# Patient Record
Sex: Male | Born: 1965 | Race: White | Hispanic: No | Marital: Married | Smoking: Never smoker
Health system: Southern US, Community
[De-identification: ages and names within clinical notes are randomized; demographics above are authoritative.]

## PROBLEM LIST (undated history)

## (undated) DIAGNOSIS — E119 Type 2 diabetes mellitus without complications: Secondary | ICD-10-CM

## (undated) DIAGNOSIS — N200 Calculus of kidney: Secondary | ICD-10-CM

## (undated) DIAGNOSIS — I10 Essential (primary) hypertension: Secondary | ICD-10-CM

## (undated) HISTORY — PX: TOTAL HIP ARTHROPLASTY: SHX124

---

## 2006-12-27 ENCOUNTER — Emergency Department (HOSPITAL_COMMUNITY): Admission: EM | Admit: 2006-12-27 | Discharge: 2006-12-27 | Payer: Self-pay | Admitting: Emergency Medicine

## 2018-02-18 ENCOUNTER — Encounter (HOSPITAL_BASED_OUTPATIENT_CLINIC_OR_DEPARTMENT_OTHER): Payer: Self-pay

## 2018-02-18 ENCOUNTER — Emergency Department (HOSPITAL_BASED_OUTPATIENT_CLINIC_OR_DEPARTMENT_OTHER): Payer: No Typology Code available for payment source

## 2018-02-18 ENCOUNTER — Other Ambulatory Visit: Payer: Self-pay

## 2018-02-18 ENCOUNTER — Emergency Department (HOSPITAL_BASED_OUTPATIENT_CLINIC_OR_DEPARTMENT_OTHER)
Admission: EM | Admit: 2018-02-18 | Discharge: 2018-02-19 | Disposition: A | Discharge status: Home / Self Care | Payer: No Typology Code available for payment source | Attending: Emergency Medicine | Admitting: Emergency Medicine

## 2018-02-18 DIAGNOSIS — R1031 Right lower quadrant pain: Secondary | ICD-10-CM | POA: Diagnosis present

## 2018-02-18 DIAGNOSIS — N200 Calculus of kidney: Secondary | ICD-10-CM | POA: Diagnosis not present

## 2018-02-18 DIAGNOSIS — Z96642 Presence of left artificial hip joint: Secondary | ICD-10-CM | POA: Diagnosis not present

## 2018-02-18 DIAGNOSIS — I10 Essential (primary) hypertension: Secondary | ICD-10-CM | POA: Diagnosis not present

## 2018-02-18 DIAGNOSIS — E119 Type 2 diabetes mellitus without complications: Secondary | ICD-10-CM | POA: Insufficient documentation

## 2018-02-18 HISTORY — DX: Calculus of kidney: N20.0

## 2018-02-18 HISTORY — DX: Type 2 diabetes mellitus without complications: E11.9

## 2018-02-18 HISTORY — DX: Essential (primary) hypertension: I10

## 2018-02-18 LAB — CBC WITH DIFFERENTIAL/PLATELET
Basophils Absolute: 0 10*3/uL (ref 0.0–0.1)
Basophils Relative: 0 %
EOS PCT: 3 %
Eosinophils Absolute: 0.2 10*3/uL (ref 0.0–0.7)
HCT: 42.6 % (ref 39.0–52.0)
Hemoglobin: 14.3 g/dL (ref 13.0–17.0)
LYMPHS ABS: 1.8 10*3/uL (ref 0.7–4.0)
LYMPHS PCT: 25 %
MCH: 31.8 pg (ref 26.0–34.0)
MCHC: 33.6 g/dL (ref 30.0–36.0)
MCV: 94.7 fL (ref 78.0–100.0)
MONO ABS: 0.5 10*3/uL (ref 0.1–1.0)
Monocytes Relative: 7 %
Neutro Abs: 4.5 10*3/uL (ref 1.7–7.7)
Neutrophils Relative %: 65 %
PLATELETS: 174 10*3/uL (ref 150–400)
RBC: 4.5 MIL/uL (ref 4.22–5.81)
RDW: 13.2 % (ref 11.5–15.5)
WBC: 6.9 10*3/uL (ref 4.0–10.5)

## 2018-02-18 LAB — BASIC METABOLIC PANEL
Anion gap: 7 (ref 5–15)
BUN: 13 mg/dL (ref 6–20)
CHLORIDE: 109 mmol/L (ref 98–111)
CO2: 24 mmol/L (ref 22–32)
CREATININE: 0.92 mg/dL (ref 0.61–1.24)
Calcium: 8.7 mg/dL — ABNORMAL LOW (ref 8.9–10.3)
GFR calc Af Amer: >60 mL/min (ref >60)
Glucose, Bld: 149 mg/dL — ABNORMAL HIGH (ref 70–99)
POTASSIUM: 3.7 mmol/L (ref 3.5–5.1)
SODIUM: 140 mmol/L (ref 135–145)

## 2018-02-18 LAB — URINALYSIS, MICROSCOPIC (REFLEX)

## 2018-02-18 LAB — URINALYSIS, ROUTINE W REFLEX MICROSCOPIC
BILIRUBIN URINE: NEGATIVE
GLUCOSE, UA: NEGATIVE mg/dL
Ketones, ur: NEGATIVE mg/dL
Leukocytes, UA: NEGATIVE
Nitrite: NEGATIVE
PROTEIN: NEGATIVE mg/dL
Specific Gravity, Urine: >1.03 — ABNORMAL HIGH (ref 1.005–1.030)
pH: 5.5 (ref 5.0–8.0)

## 2018-02-18 MED ORDER — KETOROLAC TROMETHAMINE 15 MG/ML IJ SOLN
15.0000 mg | Freq: Once | INTRAMUSCULAR | Status: AC
  Administered 2018-02-18: 15 mg via INTRAVENOUS
  Filled 2018-02-18: qty 1

## 2018-02-18 MED ORDER — HYDROMORPHONE HCL 1 MG/ML IJ SOLN
1.0000 mg | Freq: Once | INTRAMUSCULAR | Status: AC
  Administered 2018-02-18: 1 mg via INTRAVENOUS
  Filled 2018-02-18: qty 1

## 2018-02-18 MED ORDER — ONDANSETRON HCL 4 MG/2ML IJ SOLN
4.0000 mg | Freq: Once | INTRAMUSCULAR | Status: AC | PRN
  Administered 2018-02-18: 4 mg via INTRAVENOUS
  Filled 2018-02-18: qty 2

## 2018-02-18 MED ORDER — ACETAMINOPHEN 10 MG/ML IV SOLN
1000.0000 mg | Freq: Once | INTRAVENOUS | Status: DC
  Filled 2018-02-18: qty 100

## 2018-02-18 MED ORDER — MORPHINE SULFATE (PF) 4 MG/ML IV SOLN
4.0000 mg | Freq: Once | INTRAVENOUS | Status: AC
  Administered 2018-02-18: 4 mg via INTRAVENOUS
  Filled 2018-02-18: qty 1

## 2018-02-18 MED ORDER — FENTANYL CITRATE (PF) 100 MCG/2ML IJ SOLN
50.0000 ug | INTRAMUSCULAR | Status: AC | PRN
  Administered 2018-02-18 (×2): 50 ug via INTRAVENOUS
  Filled 2018-02-18 (×2): qty 2

## 2018-02-18 MED ORDER — LIDOCAINE HCL (CARDIAC) PF 100 MG/5ML IV SOSY
1.5000 mg/kg | PREFILLED_SYRINGE | Freq: Once | INTRAVENOUS | Status: AC
  Administered 2018-02-18: 224.6 mg via INTRAVENOUS
  Filled 2018-02-18: qty 15

## 2018-02-18 NOTE — ED Provider Notes (Signed)
[facility] EMERGENCY DEPARTMENT Provider Note   CSN: [id number] Arrival date & time: 02/18/18  1918    History   Chief Complaint Chief Complaint  Patient presents with  . Flank Pain    HPI Dustin Nelson is a 52 y.o. male who presents with R flank pain. PMH significant for DM, HTN, hx of kidney stones. He states that he's been having intermittent urinary urgency over the past week along with some lower abdominal discomfort. Today he woke up and felt well. This afternoon he had an acute onset of severe R flank pain after he urinated that feels similar to prior episodes of kidney stones. He reports associated nausea without vomiting. No fever, chills, diarrhea, constipation, hematuria. His last kidney stone was ~3 years ago which he thinks he was able to pass however he has required multiple surgeries to have the kidney stones removed. Pain is currently 10/10.  HPI  Past Medical History:  Diagnosis Date  . Diabetes mellitus without complication (HCC)   . Hypertension   . Kidney stones     There are no active problems to display for this patient.   Past Surgical History:  Procedure Laterality Date  . TOTAL HIP ARTHROPLASTY Left         Home Medications    Prior to Admission medications   Not on File    Family History No family history on file.  Social History Social History   Tobacco Use  . Smoking status: Never Smoker  . Smokeless tobacco: Never Used  Substance Use Topics  . Alcohol use: Never    Frequency: Never  . Drug use: Never     Allergies   Patient has no known allergies.   Review of Systems Review of Systems  Constitutional: Negative for chills and fever.  Gastrointestinal: Positive for abdominal pain and nausea. Negative for constipation, diarrhea and vomiting.  Genitourinary: Positive for difficulty urinating, dysuria and flank pain. Negative for frequency and hematuria.  All other systems reviewed and are  negative.    Physical Exam Updated Vital Signs BP (!) 151/98 (BP Location: Right Arm)   Pulse 86   Temp 98.2 F (36.8 C) (Oral)   Resp (!) 28   Ht 6\' 4"  (1.93 m)   Wt (!) 149.7 kg (330 lb)   SpO2 98%   BMI 40.17 kg/m   Physical Exam  Constitutional: He is oriented to person, place, and time. He appears well-developed and well-nourished. He appears distressed (mildly due to pain).  HENT:  Head: Normocephalic and atraumatic.  Eyes: Pupils are equal, round, and reactive to light. Conjunctivae are normal. Right eye exhibits no discharge. Left eye exhibits no discharge. No scleral icterus.  Neck: Normal range of motion.  Cardiovascular: Normal rate and regular rhythm.  Pulmonary/Chest: Effort normal and breath sounds normal. No respiratory distress.  Abdominal: Soft. Bowel sounds are normal. He exhibits no distension and no mass. There is tenderness (R flank and CVA tenderness). There is no rebound and no guarding. No hernia.  Obese abdomen  Neurological: He is alert and oriented to person, place, and time.  Skin: Skin is warm and dry.  Psychiatric: He has a normal mood and affect. His behavior is normal.  Nursing note and vitals reviewed.    ED Treatments / Results  Labs (all labs ordered are listed, but only abnormal results are displayed) Labs Reviewed  URINALYSIS, ROUTINE W REFLEX MICROSCOPIC - Abnormal; Notable for the following components:      Result Value  Specific Gravity, Urine >1.030 (*)    Hgb urine dipstick LARGE (*)    All other components within normal limits  BASIC METABOLIC PANEL - Abnormal; Notable for the following components:   Glucose, Bld 149 (*)    Calcium 8.7 (*)    All other components within normal limits  URINALYSIS, MICROSCOPIC (REFLEX) - Abnormal; Notable for the following components:   Bacteria, UA RARE (*)    All other components within normal limits  CBC WITH DIFFERENTIAL/PLATELET    EKG None  Radiology Ct Renal Stone  Study  Result Date: 02/18/2018 CLINICAL DATA:  Right flank pain today EXAM: CT ABDOMEN AND PELVIS WITHOUT CONTRAST TECHNIQUE: Multidetector CT imaging of the abdomen and pelvis was performed following the standard protocol without IV contrast. COMPARISON:  12/27/2006 FINDINGS: Lower chest: Normal heart size without pericardial effusion. Clear lung bases. Hepatobiliary: Hepatic steatosis without space-occupying mass. Decompressed gallbladder without stones. Faint hyperdensities about the gallbladder fossa likely reflect areas of focal fatty sparing. Pancreas: Normal Spleen: Normal Adrenals/Urinary Tract: Left-sided renal calculi measuring to 5 mm in the interpolar and lower pole. No left-sided hydroureteronephrosis nor obstruction. Mild right-sided hydroureteronephrosis secondary to a stone in the right ureterovesical junction measuring 9 x 4 x 8 mm. The urinary bladder is decompressed in appearance. Stomach/Bowel: Stomach is within normal limits. Appendix appears normal. No evidence of bowel wall thickening, distention, or inflammatory changes. Vascular/Lymphatic: No significant vascular findings are present. No enlarged abdominal or pelvic lymph nodes. Reproductive: Normal prostate and seminal vesicles. Other: No free air nor free fluid. Small fat containing umbilical hernia. Musculoskeletal: No acute osseous abnormality. Mild facet arthropathy from L3 through S1. IMPRESSION: 1. Mild right-sided hydroureteronephrosis secondary to a 9 x 4 x 8 mm calculus at the right ureterovesical junction. 2. Left-sided nonobstructing renal calculi measuring up to 5 mm. 3. Hepatic steatosis. Electronically Signed   By: Tollie Eth M.D.   On: 02/18/2018 20:27    Procedures Procedures (including critical care time)  Medications Ordered in ED Medications  fentaNYL (SUBLIMAZE) injection 50 mcg (50 mcg Intravenous Given 02/18/18 2043)  ondansetron (ZOFRAN) injection 4 mg (4 mg Intravenous Given 02/18/18 1938)  ketorolac  (TORADOL) 15 MG/ML injection 15 mg (15 mg Intravenous Given 02/18/18 2024)  HYDROmorphone (DILAUDID) injection 1 mg (1 mg Intravenous Given 02/18/18 2101)  morphine 4 MG/ML injection 4 mg (4 mg Intravenous Given 02/18/18 2232)  lidocaine (cardiac) 100 mg/60mL (XYLOCAINE) injection 2% 224.6 mg (224.6 mg Intravenous Given 02/18/18 2329)     Initial Impression / Assessment and Plan / ED Course  I have reviewed the triage vital signs and the nursing notes.  Pertinent labs & imaging results that were available during my care of the patient were reviewed by me and considered in my medical decision making (see chart for details).  52 year old male with R flank pain starting today which feels similar to prior episodes of kidney stones. He is hypertensive but otherwise vitals are normal. He is in mild distress due to pain on exam. Will obtain labs, UA, CT renal  CBC is normal. BMP is remarkable for mild hyperglycemia. Kidney function is normal. UA has large hgb, >1.030 specific gravity, rare bacteria, 21-50 RBC. He has received Fentanyl x 2, 1mg  Diluadid, Toradol with mild relief. Pain is still 7/10. Will consult Urology.  10:34 PM Spoke with Dr. Vernie Ammons with Urology. He advised IV Lidocaine and Tylenol.  11:28 PM Pain is 5/10 after Morphine. We do not have Tylenol in pyxis here.  Will give Lidocaine and reassess.  12:17 AM Pt reports pain is improved. He states his whole body feels numb. He is agreeable to going home and following up as outpatient. Rx for pain medicine, Ibuprofen, Zofran given. He has Flomax. Referral to urology was placed. Return precautions given.   Final Clinical Impressions(s) / ED Diagnoses   Final diagnoses:  Right kidney stone    ED Discharge Orders    None       Bethel BornGekas, Zyiere Rosemond Marie, PA-C 02/19/18 0017    Gwyneth SproutPlunkett, Whitney, MD 02/19/18 1343

## 2018-02-18 NOTE — ED Notes (Signed)
Family at bedside. 

## 2018-02-18 NOTE — Discharge Instructions (Addendum)
Take narcotic pain medicine as needed. Do not drink alcohol or drive while taking this medicine Ibuprofen 600mg  - Take for pain and inflammation. Take with food three times daily Take Zofran for nausea Take Flomax to help stone pass Call urology tomorrow for an appointment

## 2018-02-18 NOTE — ED Notes (Signed)
No changes on monitor after first 100mg s of lidocaine

## 2018-02-18 NOTE — ED Triage Notes (Signed)
Pt reports problem with urine flow this week, but today sudden onset of severe pain. Pt states feels like previous kidney stones.

## 2018-02-18 NOTE — ED Notes (Signed)
patient is aware we need a urine specimen. Patient will try as soon as RN is done with IV.

## 2018-02-18 NOTE — ED Notes (Signed)
ED Provider at bedside. 

## 2018-02-19 MED ORDER — OXYCODONE-ACETAMINOPHEN 5-325 MG PO TABS: 1.0000 | ORAL_TABLET | ORAL | 0 refills | Status: DC | PRN

## 2018-02-19 MED ORDER — IBUPROFEN 600 MG PO TABS: 600.0000 mg | ORAL_TABLET | Freq: Four times a day (QID) | ORAL | 0 refills | Status: AC | PRN

## 2018-02-19 MED ORDER — ONDANSETRON 4 MG PO TBDP: 4.0000 mg | ORAL_TABLET | Freq: Three times a day (TID) | ORAL | 0 refills | Status: DC | PRN

## 2019-05-25 IMAGING — CT CT RENAL STONE PROTOCOL
2 of 4 series · 17 of 46 positions shown, 19 images · non-contrast
Comparison: 12/27/2006

CLINICAL DATA: Right flank pain today

EXAM:
CT ABDOMEN AND PELVIS WITHOUT CONTRAST
TECHNIQUE: Multidetector CT imaging of the abdomen and pelvis was performed
following the standard protocol without IV contrast.

[Series 2: axial st · axial · 0.90mm/px · z∈[-458,+38]mm · 14 of 109 slices shown, 16 images]
[im 5/109  soft-tissue]
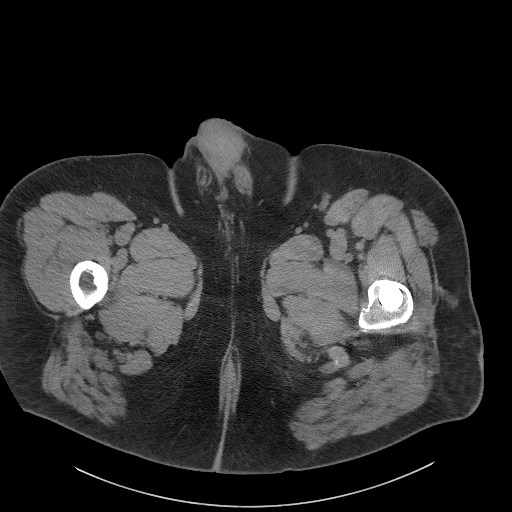
[im 5/109  bone]
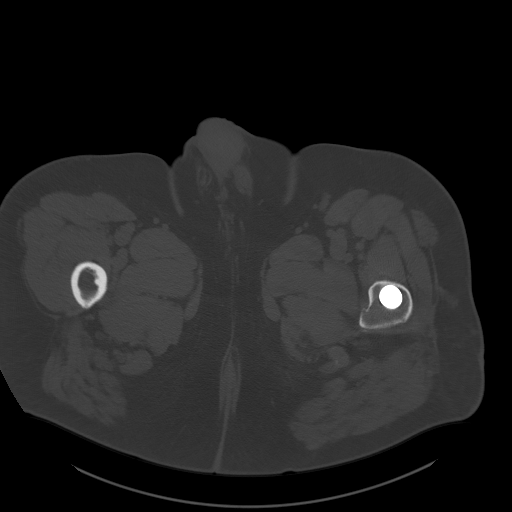
[im 14/109  soft-tissue]
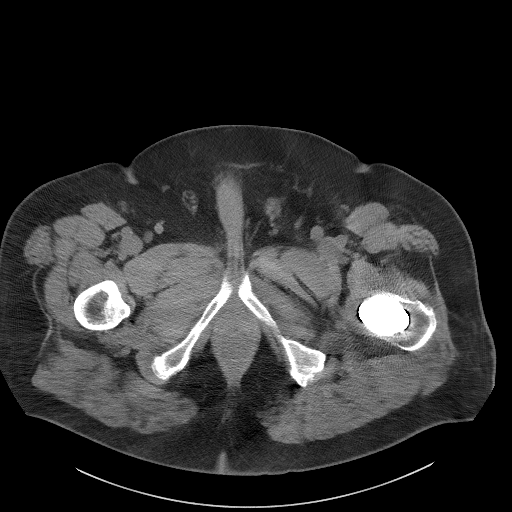
[im 23/109  soft-tissue]
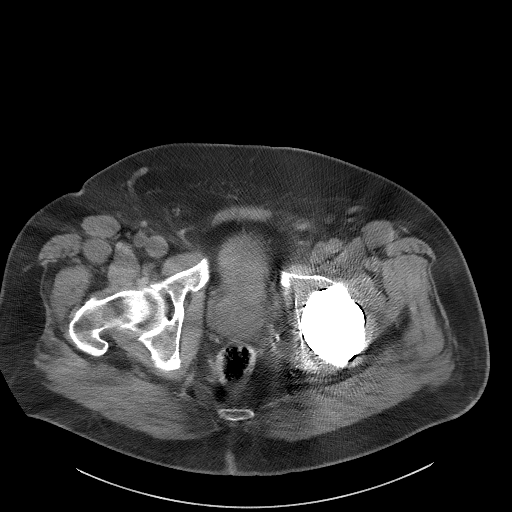
[im 28/109  soft-tissue]
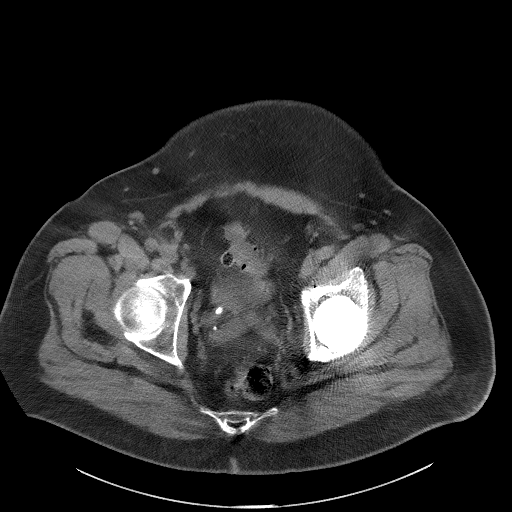
[im 37/109  soft-tissue]
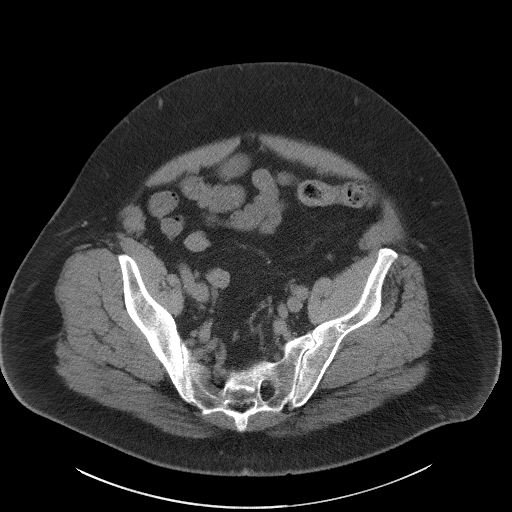
[im 46/109  soft-tissue]
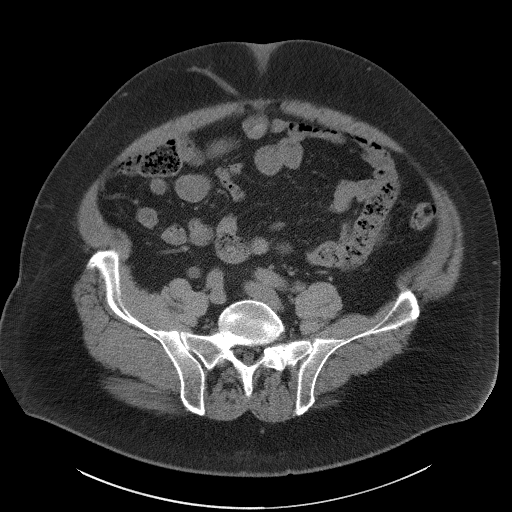
[im 50/109  soft-tissue]
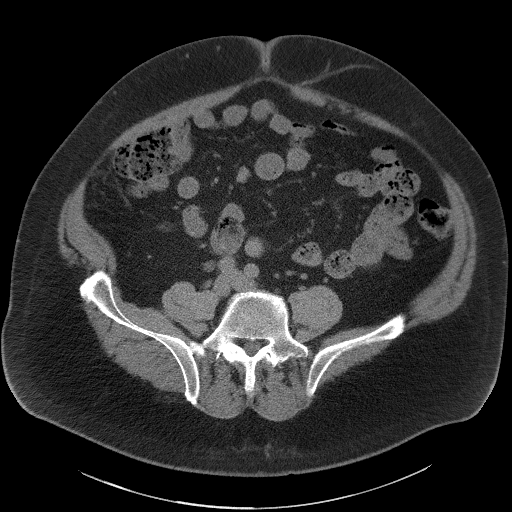
[im 59/109  soft-tissue]
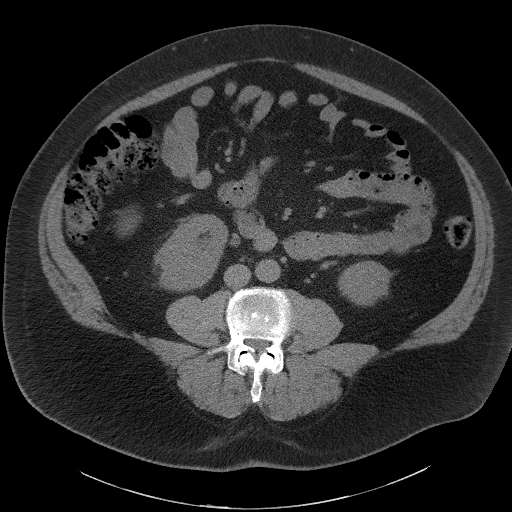
[im 64/109  soft-tissue]
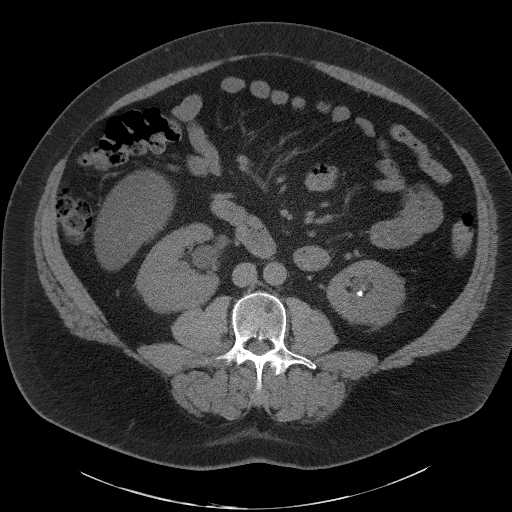
[im 64/109  bone]
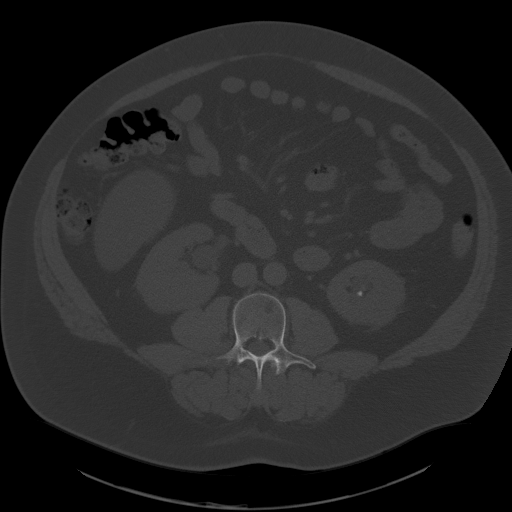
[im 73/109  soft-tissue]
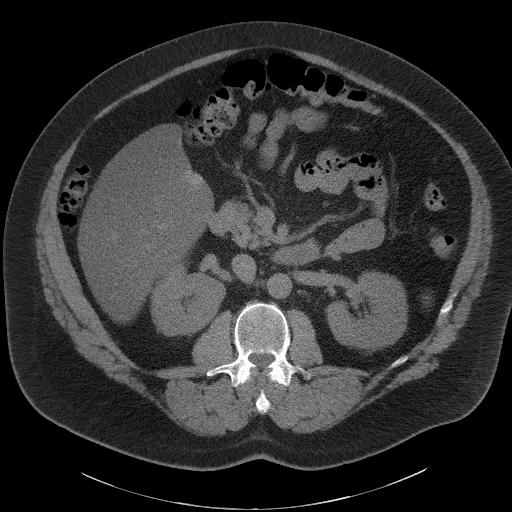
[im 82/109  soft-tissue]
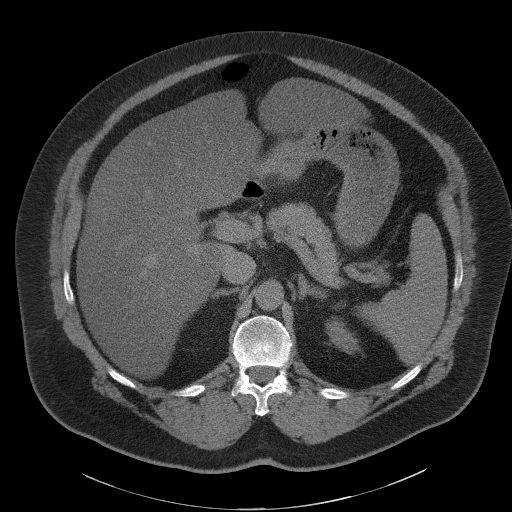
[im 86/109  soft-tissue]
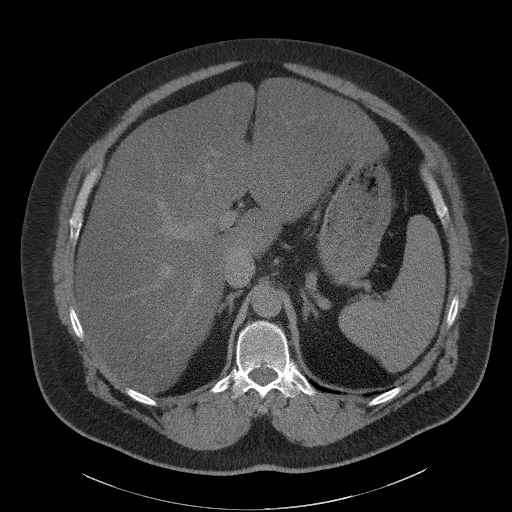
[im 95/109  soft-tissue]
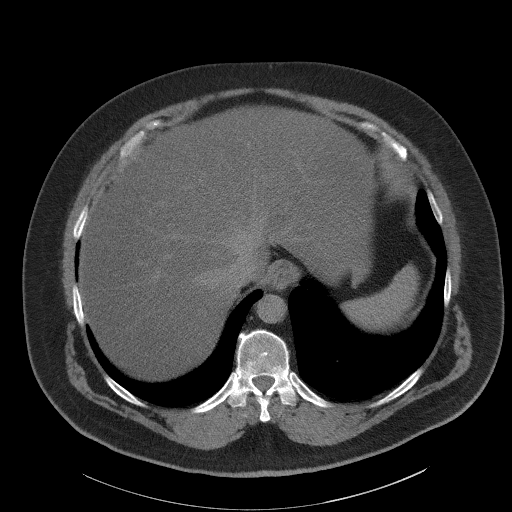
[im 104/109  soft-tissue]
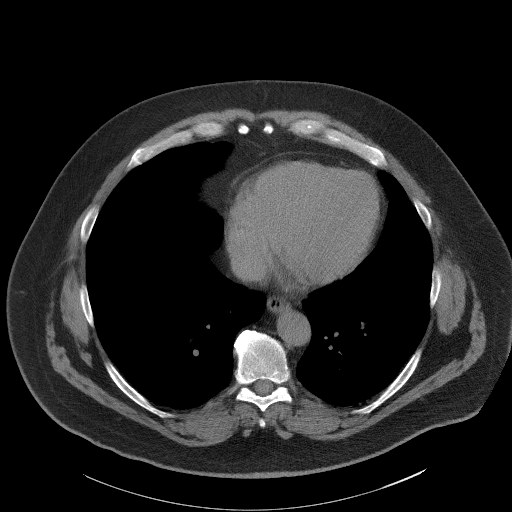

[Series 5: coronal st · coronal · 1.06mm/px · 3 of 133 slices shown]
[im 45/133  soft-tissue]
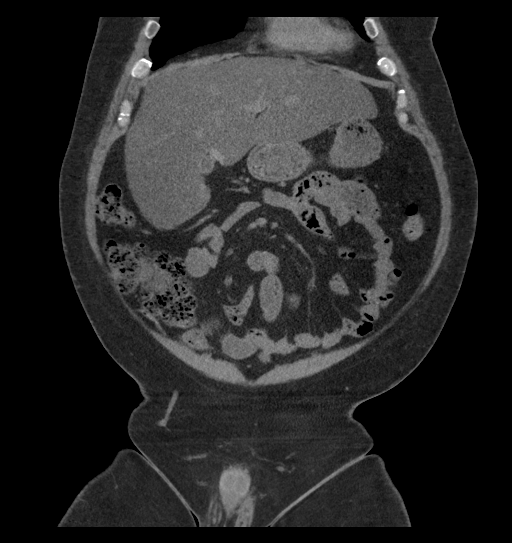
[im 59/133  soft-tissue]
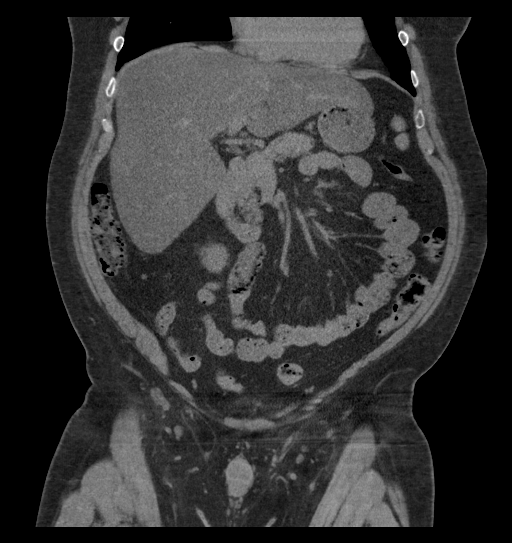
[im 74/133  soft-tissue]
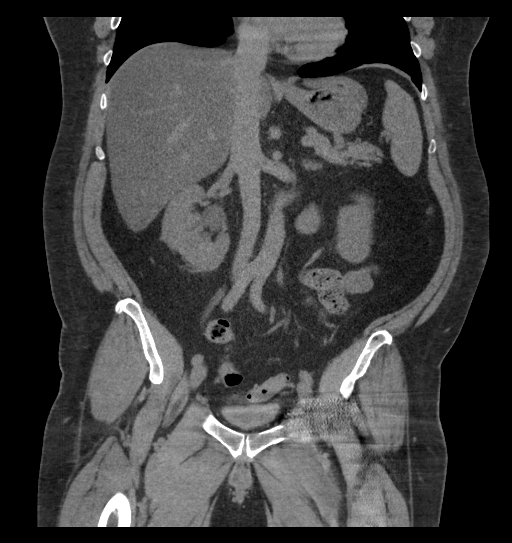

[17 of 46 positions shown; findings below may reference images not displayed]

FINDINGS: Lower chest: Normal heart size without pericardial effusion. Clear
lung bases.

Hepatobiliary: Hepatic steatosis without space-occupying mass.
Decompressed gallbladder without stones. Faint hyperdensities about
the gallbladder fossa likely reflect areas of focal fatty sparing.

Pancreas: Normal

Spleen: Normal

Adrenals/Urinary Tract: Left-sided renal calculi measuring to 5 mm
in the interpolar and lower pole. No left-sided
hydroureteronephrosis nor obstruction. Mild right-sided
hydroureteronephrosis secondary to a stone in the right
ureterovesical junction measuring 9 x 4 x 8 mm. The urinary bladder
is decompressed in appearance.

Stomach/Bowel: Stomach is within normal limits. Appendix appears
normal. No evidence of bowel wall thickening, distention, or
inflammatory changes.

Vascular/Lymphatic: No significant vascular findings are present. No
enlarged abdominal or pelvic lymph nodes.

Reproductive: Normal prostate and seminal vesicles.

Other: No free air nor free fluid. Small fat containing umbilical
hernia.

Musculoskeletal: No acute osseous abnormality. Mild facet
arthropathy from L3 through S1.
IMPRESSION: 1. Mild right-sided hydroureteronephrosis secondary to a 9 x 4 x 8
mm calculus at the right ureterovesical junction.
2. Left-sided nonobstructing renal calculi measuring up to 5 mm.
3. Hepatic steatosis.

## 2019-07-19 ENCOUNTER — Other Ambulatory Visit: Payer: Self-pay

## 2019-07-19 ENCOUNTER — Emergency Department (HOSPITAL_BASED_OUTPATIENT_CLINIC_OR_DEPARTMENT_OTHER)
Admission: EM | Admit: 2019-07-19 | Discharge: 2019-07-19 | Disposition: A | Discharge status: Home / Self Care | Payer: PRIVATE HEALTH INSURANCE | Attending: Emergency Medicine | Admitting: Emergency Medicine

## 2019-07-19 ENCOUNTER — Emergency Department (HOSPITAL_BASED_OUTPATIENT_CLINIC_OR_DEPARTMENT_OTHER): Payer: PRIVATE HEALTH INSURANCE

## 2019-07-19 ENCOUNTER — Encounter (HOSPITAL_BASED_OUTPATIENT_CLINIC_OR_DEPARTMENT_OTHER): Payer: Self-pay

## 2019-07-19 DIAGNOSIS — M25562 Pain in left knee: Secondary | ICD-10-CM | POA: Insufficient documentation

## 2019-07-19 DIAGNOSIS — W010XXA Fall on same level from slipping, tripping and stumbling without subsequent striking against object, initial encounter: Secondary | ICD-10-CM | POA: Diagnosis not present

## 2019-07-19 DIAGNOSIS — I10 Essential (primary) hypertension: Secondary | ICD-10-CM | POA: Insufficient documentation

## 2019-07-19 DIAGNOSIS — Y999 Unspecified external cause status: Secondary | ICD-10-CM | POA: Insufficient documentation

## 2019-07-19 DIAGNOSIS — W19XXXA Unspecified fall, initial encounter: Secondary | ICD-10-CM

## 2019-07-19 DIAGNOSIS — M791 Myalgia, unspecified site: Secondary | ICD-10-CM | POA: Diagnosis not present

## 2019-07-19 DIAGNOSIS — M25512 Pain in left shoulder: Secondary | ICD-10-CM | POA: Insufficient documentation

## 2019-07-19 DIAGNOSIS — Y929 Unspecified place or not applicable: Secondary | ICD-10-CM | POA: Diagnosis not present

## 2019-07-19 DIAGNOSIS — S62114A Nondisplaced fracture of triquetrum [cuneiform] bone, right wrist, initial encounter for closed fracture: Secondary | ICD-10-CM | POA: Insufficient documentation

## 2019-07-19 DIAGNOSIS — E119 Type 2 diabetes mellitus without complications: Secondary | ICD-10-CM | POA: Insufficient documentation

## 2019-07-19 DIAGNOSIS — Y9301 Activity, walking, marching and hiking: Secondary | ICD-10-CM | POA: Diagnosis not present

## 2019-07-19 DIAGNOSIS — S6991XA Unspecified injury of right wrist, hand and finger(s), initial encounter: Secondary | ICD-10-CM | POA: Diagnosis present

## 2019-07-19 MED ORDER — IBUPROFEN 600 MG PO TABS: 600.0000 mg | ORAL_TABLET | Freq: Four times a day (QID) | ORAL | 0 refills | Status: DC | PRN

## 2019-07-19 MED ORDER — DIAZEPAM 5 MG PO TABS: 10.0000 mg | ORAL_TABLET | Freq: Every evening | ORAL | 0 refills | Status: DC | PRN

## 2019-07-19 MED ORDER — IBUPROFEN 800 MG PO TABS
800.0000 mg | ORAL_TABLET | Freq: Once | ORAL | Status: AC
  Administered 2019-07-19: 800 mg via ORAL
  Filled 2019-07-19: qty 1

## 2019-07-19 NOTE — ED Notes (Signed)
Pt returned to nurse first after leaving [facility], states he can get the meds cheaper at [place], requesting we print prescritions. Pharmacy changed in computer and MD resent script to [facility] main, pt informed

## 2019-07-19 NOTE — Discharge Instructions (Addendum)
You have a small fracture of the right wrist (triquetrum bone).  We applied a splint today.  You should call to follow-up with an orthopedic hand surgeon in approximately 1 to 2 weeks.  In the meantime you should wear your splint at all times.

## 2019-07-19 NOTE — ED Provider Notes (Signed)
[facility] EMERGENCY DEPARTMENT Provider Note   CSN: [id number] Arrival date & time: 07/19/19  1234     History   Chief Complaint Chief Complaint  Patient presents with  . Fall    HPI Dustin Nelson is a 53 y.o. male male with a history of hypertension, hyperlipidemia, diabetes, obesity, present emergency department for mechanical fall.  Reports he tripped and unsteady pavement yesterday and fell forward onto both hands.  He says he took a large tumble and banged his knees, as well as left shoulder.  He now presents to the ER complaining that he has pain all over his body.  Pain is worse in his left shoulder and his left knee and his right wrist.  He denies striking his head or loss of consciousness.     HPI  Past Medical History:  Diagnosis Date  . Diabetes mellitus without complication (HCC)   . Hypertension   . Kidney stones     There are no active problems to display for this patient.   Past Surgical History:  Procedure Laterality Date  . TOTAL HIP ARTHROPLASTY Left         Home Medications    Prior to Admission medications   Medication Sig Start Date End Date Taking? Authorizing Provider  diazepam (VALIUM) 5 MG tablet Take 2 tablets (10 mg total) by mouth at bedtime as needed for up to 10 doses for muscle spasms. 07/19/19   Terald Sleeperrifan, Kellon Chalk J, MD  ibuprofen (ADVIL) 600 MG tablet Take 1 tablet (600 mg total) by mouth every 6 (six) hours as needed for up to 30 doses for mild pain or moderate pain. 07/19/19   Terald Sleeperrifan, Kris Burd J, MD  ibuprofen (ADVIL,MOTRIN) 600 MG tablet Take 1 tablet (600 mg total) by mouth every 6 (six) hours as needed. 02/19/18   Bethel BornGekas, Kelly Marie, PA-C  ondansetron (ZOFRAN ODT) 4 MG disintegrating tablet Take 1 tablet (4 mg total) by mouth every 8 (eight) hours as needed for nausea or vomiting. 02/19/18   Bethel BornGekas, Kelly Marie, PA-C  oxyCODONE-acetaminophen (PERCOCET/ROXICET) 5-325 MG tablet Take 1 tablet by mouth every 4 (four) hours as  needed for severe pain. 02/19/18   Bethel BornGekas, Kelly Marie, PA-C    Family History No family history on file.  Social History Social History   Tobacco Use  . Smoking status: Never Smoker  . Smokeless tobacco: Never Used  Substance Use Topics  . Alcohol use: Never    Frequency: Never  . Drug use: Never     Allergies   Patient has no known allergies.   Review of Systems Review of Systems  Respiratory: Negative for cough and shortness of breath.   Cardiovascular: Negative for chest pain and palpitations.  Gastrointestinal: Negative for abdominal pain, nausea and vomiting.  Genitourinary: Negative for dysuria and hematuria.  Musculoskeletal: Positive for arthralgias, back pain, joint swelling and myalgias. Negative for neck pain.  Skin: Positive for wound. Negative for rash.  Neurological: Negative for syncope and light-headedness.  Psychiatric/Behavioral: Negative for agitation and confusion.  All other systems reviewed and are negative.    Physical Exam Updated Vital Signs BP (!) 145/85 (BP Location: Right Arm)   Pulse 77   Temp 99 F (37.2 C) (Oral)   Resp 14   Ht 6\' 4"  (1.93 m)   Wt 136.1 kg   SpO2 98%   BMI 36.52 kg/m   Physical Exam Vitals signs and nursing note reviewed.  Constitutional:      Appearance: He is  well-developed.  HENT:     Head: Normocephalic and atraumatic.  Eyes:     Conjunctiva/sclera: Conjunctivae normal.  Neck:     Musculoskeletal: Normal range of motion and neck supple. No neck rigidity or muscular tenderness.  Cardiovascular:     Rate and Rhythm: Normal rate and regular rhythm.     Pulses: Normal pulses.  Pulmonary:     Effort: Pulmonary effort is normal. No respiratory distress.  Abdominal:     Palpations: Abdomen is soft.     Tenderness: There is no abdominal tenderness.  Musculoskeletal:     Comments: TTP of left patella TTP of right wrist TTP of left shoulder  Skin:    General: Skin is warm and dry.  Neurological:      Mental Status: He is alert.  Psychiatric:        Mood and Affect: Mood normal.        Behavior: Behavior normal.      ED Treatments / Results  Labs (all labs ordered are listed, but only abnormal results are displayed) Labs Reviewed - No data to display  EKG None  Radiology Dg Wrist Complete Right  Result Date: 07/19/2019 CLINICAL DATA:  Fall on outstretched hand EXAM: RIGHT WRIST - COMPLETE 3+ VIEW COMPARISON:  None. FINDINGS: There is a mildly displaced triquetral fracture with overlying soft tissue swelling. No additional fractures are identified. Carpal intraosseous spaces are maintained. No dislocation. IMPRESSION: Acute mildly displaced dorsal triquetral avulsion fracture. Electronically Signed   By: Davina Poke M.D.   On: 07/19/2019 14:24   Dg Shoulder Left  Result Date: 07/19/2019 CLINICAL DATA:  Left shoulder pain after fall EXAM: LEFT SHOULDER - 2+ VIEW COMPARISON:  None. FINDINGS: There is no evidence of fracture or dislocation. There is no evidence of arthropathy or other focal bone abnormality. Soft tissues are unremarkable. IMPRESSION: Negative. Electronically Signed   By: Davina Poke M.D.   On: 07/19/2019 14:25   Dg Knee Complete 4 Views Left  Result Date: 07/19/2019 CLINICAL DATA:  Left knee pain after fall. EXAM: LEFT KNEE - COMPLETE 4+ VIEW COMPARISON:  None. FINDINGS: No acute fracture or dislocation. No joint effusion. Joint spaces are preserved. Tiny lateral femoral condyle marginal osteophyte. Bone mineralization is normal. Soft tissues are unremarkable. IMPRESSION: No acute osseous abnormality. Electronically Signed   By: Titus Dubin M.D.   On: 07/19/2019 14:13    Procedures Procedures (including critical care time)  Medications Ordered in ED Medications  ibuprofen (ADVIL) tablet 800 mg (800 mg Oral Given 07/19/19 1341)     Initial Impression / Assessment and Plan / ED Course  I have reviewed the triage vital signs and the nursing notes.   Pertinent labs & imaging results that were available during my care of the patient were reviewed by me and considered in my medical decision making (see chart for details).  Patient is left handed  53 year old male presenting to the emergency department with mechanical fall yesterday.  He presents with right wrist pain, left knee pain, left shoulder pain.  No evidence of head trauma.  Fall was from standing.  Obtain x-rays of his injuries evaluate for fracture.  Ibuprofen for pain control.    Volar splint of right wrist, f/u as outpatient  Final Clinical Impressions(s) / ED Diagnoses   Final diagnoses:  Closed nondisplaced fracture of triquetrum of right wrist, initial encounter  Fall, initial encounter  Muscle pain    ED Discharge Orders  Ordered    ibuprofen (ADVIL) 600 MG tablet  Every 6 hours PRN     07/19/19 1512    diazepam (VALIUM) 5 MG tablet  At bedtime PRN,   Status:  Discontinued     07/19/19 1512    diazepam (VALIUM) 5 MG tablet  At bedtime PRN     07/19/19 1557           Terald Sleeper, MD 07/19/19 1559

## 2019-07-19 NOTE — ED Triage Notes (Signed)
Pt states he tripped/fell last night-pain to left shoulder, right wrist.hand, left hand, both knees, right ankle-"my whole body hurts"-NAD-steady gait

## 2019-10-12 ENCOUNTER — Other Ambulatory Visit: Payer: Self-pay

## 2019-10-12 ENCOUNTER — Emergency Department (HOSPITAL_BASED_OUTPATIENT_CLINIC_OR_DEPARTMENT_OTHER)
Admission: EM | Admit: 2019-10-12 | Discharge: 2019-10-12 | Disposition: A | Discharge status: Home / Self Care | Payer: PRIVATE HEALTH INSURANCE | Attending: Emergency Medicine | Admitting: Emergency Medicine

## 2019-10-12 ENCOUNTER — Emergency Department (HOSPITAL_BASED_OUTPATIENT_CLINIC_OR_DEPARTMENT_OTHER): Payer: PRIVATE HEALTH INSURANCE

## 2019-10-12 ENCOUNTER — Encounter (HOSPITAL_BASED_OUTPATIENT_CLINIC_OR_DEPARTMENT_OTHER): Payer: Self-pay

## 2019-10-12 DIAGNOSIS — Z7984 Long term (current) use of oral hypoglycemic drugs: Secondary | ICD-10-CM | POA: Insufficient documentation

## 2019-10-12 DIAGNOSIS — N200 Calculus of kidney: Secondary | ICD-10-CM

## 2019-10-12 DIAGNOSIS — I10 Essential (primary) hypertension: Secondary | ICD-10-CM | POA: Insufficient documentation

## 2019-10-12 DIAGNOSIS — N132 Hydronephrosis with renal and ureteral calculous obstruction: Secondary | ICD-10-CM | POA: Insufficient documentation

## 2019-10-12 DIAGNOSIS — Z79899 Other long term (current) drug therapy: Secondary | ICD-10-CM | POA: Diagnosis not present

## 2019-10-12 DIAGNOSIS — E119 Type 2 diabetes mellitus without complications: Secondary | ICD-10-CM | POA: Insufficient documentation

## 2019-10-12 DIAGNOSIS — R1032 Left lower quadrant pain: Secondary | ICD-10-CM | POA: Diagnosis present

## 2019-10-12 LAB — CBC WITH DIFFERENTIAL/PLATELET
Abs Immature Granulocytes: 0.05 10*3/uL (ref 0.00–0.07)
Basophils Absolute: 0 10*3/uL (ref 0.0–0.1)
Basophils Relative: 0 %
Eosinophils Absolute: 0.1 10*3/uL (ref 0.0–0.5)
Eosinophils Relative: 1 %
HCT: 45.9 % (ref 39.0–52.0)
Hemoglobin: 15.2 g/dL (ref 13.0–17.0)
Immature Granulocytes: 0 %
Lymphocytes Relative: 12 %
Lymphs Abs: 1.3 10*3/uL (ref 0.7–4.0)
MCH: 30.6 pg (ref 26.0–34.0)
MCHC: 33.1 g/dL (ref 30.0–36.0)
MCV: 92.4 fL (ref 80.0–100.0)
Monocytes Absolute: 0.8 10*3/uL (ref 0.1–1.0)
Monocytes Relative: 7 %
Neutro Abs: 8.9 10*3/uL — ABNORMAL HIGH (ref 1.7–7.7)
Neutrophils Relative %: 80 %
Platelets: 182 10*3/uL (ref 150–400)
RBC: 4.97 MIL/uL (ref 4.22–5.81)
RDW: 13.2 % (ref 11.5–15.5)
WBC: 11.2 10*3/uL — ABNORMAL HIGH (ref 4.0–10.5)
nRBC: 0 % (ref 0.0–0.2)

## 2019-10-12 LAB — URINALYSIS, ROUTINE W REFLEX MICROSCOPIC
Bilirubin Urine: NEGATIVE
Glucose, UA: >=500 mg/dL — AB
Ketones, ur: NEGATIVE mg/dL
Leukocytes,Ua: NEGATIVE
Nitrite: NEGATIVE
Protein, ur: NEGATIVE mg/dL
Specific Gravity, Urine: 1.02 (ref 1.005–1.030)
pH: 6 (ref 5.0–8.0)

## 2019-10-12 LAB — BASIC METABOLIC PANEL
Anion gap: 11 (ref 5–15)
BUN: 18 mg/dL (ref 6–20)
CO2: 21 mmol/L — ABNORMAL LOW (ref 22–32)
Calcium: 9.1 mg/dL (ref 8.9–10.3)
Chloride: 101 mmol/L (ref 98–111)
Creatinine, Ser: 0.66 mg/dL (ref 0.61–1.24)
GFR calc Af Amer: >60 mL/min (ref >60)
GFR calc non Af Amer: >60 mL/min (ref >60)
Glucose, Bld: 326 mg/dL — ABNORMAL HIGH (ref 70–99)
Potassium: 3.8 mmol/L (ref 3.5–5.1)
Sodium: 133 mmol/L — ABNORMAL LOW (ref 135–145)

## 2019-10-12 LAB — URINALYSIS, MICROSCOPIC (REFLEX)

## 2019-10-12 MED ORDER — SODIUM CHLORIDE 0.9 % IV SOLN
1.0000 g | Freq: Once | INTRAVENOUS | Status: AC
  Administered 2019-10-12: 1 g via INTRAVENOUS
  Filled 2019-10-12: qty 10

## 2019-10-12 MED ORDER — TAMSULOSIN HCL 0.4 MG PO CAPS: 0.4000 mg | ORAL_CAPSULE | Freq: Every day | ORAL | 0 refills | Status: AC

## 2019-10-12 MED ORDER — HYDROCODONE-ACETAMINOPHEN 5-325 MG PO TABS: 1.0000 | ORAL_TABLET | Freq: Four times a day (QID) | ORAL | 0 refills | Status: DC | PRN

## 2019-10-12 MED ORDER — ONDANSETRON HCL 4 MG PO TABS: 4.0000 mg | ORAL_TABLET | Freq: Four times a day (QID) | ORAL | 0 refills | Status: DC

## 2019-10-12 MED ORDER — CEPHALEXIN 500 MG PO CAPS: 500.0000 mg | ORAL_CAPSULE | Freq: Two times a day (BID) | ORAL | 0 refills | Status: DC

## 2019-10-12 MED ORDER — SODIUM CHLORIDE 0.9 % IV BOLUS (SEPSIS)
1000.0000 mL | Freq: Once | INTRAVENOUS | Status: AC
  Administered 2019-10-12: 12:00:00 1000 mL via INTRAVENOUS

## 2019-10-12 MED ORDER — ONDANSETRON HCL 4 MG/2ML IJ SOLN
4.0000 mg | Freq: Once | INTRAMUSCULAR | Status: AC
  Administered 2019-10-12: 4 mg via INTRAVENOUS
  Filled 2019-10-12: qty 2

## 2019-10-12 MED ORDER — TAMSULOSIN HCL 0.4 MG PO CAPS: 0.4000 mg | ORAL_CAPSULE | Freq: Every day | ORAL | 0 refills | Status: DC

## 2019-10-12 MED ORDER — CEPHALEXIN 500 MG PO CAPS: 500.0000 mg | ORAL_CAPSULE | Freq: Two times a day (BID) | ORAL | 0 refills | Status: AC

## 2019-10-12 MED ORDER — MORPHINE SULFATE (PF) 4 MG/ML IV SOLN
4.0000 mg | Freq: Once | INTRAVENOUS | Status: AC
  Administered 2019-10-12: 4 mg via INTRAVENOUS
  Filled 2019-10-12: qty 1

## 2019-10-12 MED ORDER — KETOROLAC TROMETHAMINE 15 MG/ML IJ SOLN
15.0000 mg | Freq: Once | INTRAMUSCULAR | Status: AC
  Administered 2019-10-12: 15 mg via INTRAVENOUS
  Filled 2019-10-12: qty 1

## 2019-10-12 MED ORDER — HYDROCODONE-ACETAMINOPHEN 5-325 MG PO TABS: 1.0000 | ORAL_TABLET | Freq: Four times a day (QID) | ORAL | 0 refills | Status: AC | PRN

## 2019-10-12 MED ORDER — TAMSULOSIN HCL 0.4 MG PO CAPS
0.4000 mg | ORAL_CAPSULE | Freq: Once | ORAL | Status: AC
  Administered 2019-10-12: 0.4 mg via ORAL
  Filled 2019-10-12: qty 1

## 2019-10-12 NOTE — ED Triage Notes (Addendum)
Pt c/o left flank pain x 3 hours-grimacing-states feels like kidney stone

## 2019-10-12 NOTE — Discharge Instructions (Addendum)
You are seen today for kidney stones.  You are treated with fluids, pain medication, nausea medication and Flomax. It is not likely you will pass this stone on your own given the size of the stones together. Please call urology ASAP for an appointment. Thank you for allowing me to care for you today. Please return to the emergency department if you have new or worsening symptoms. Specifically if you have uncontrollable nausea, vomiting or uncontrolled pain or fever or you are unable to urinate. Take your medications as instructed.

## 2019-10-12 NOTE — ED Provider Notes (Signed)
[facility] EMERGENCY DEPARTMENT Provider Note   CSN: [id number] Arrival date & time: 10/12/19  1114     History Chief Complaint  Patient presents with  . Flank Pain    Dustin Nelson is a 54 y.o. male.  Patient is a 54 year old gentleman with past medical history of hypertension, kidney stones, diabetes presenting to the emergency department for acute onset of left flank pain radiating to his groin for about 3 hours.  Reports this feels similar to stone she has had in the past.  Reports last kidney stone was a year and half ago and required lithotripsy.  He denies any hematuria, difficulty urinating, fever, nausea, vomiting.        Past Medical History:  Diagnosis Date  . Diabetes mellitus without complication (HCC)   . Hypertension   . Kidney stones     There are no problems to display for this patient.   Past Surgical History:  Procedure Laterality Date  . TOTAL HIP ARTHROPLASTY Left        No family history on file.  Social History   Tobacco Use  . Smoking status: Never Smoker  . Smokeless tobacco: Never Used  Substance Use Topics  . Alcohol use: Never  . Drug use: Never    Home Medications Prior to Admission medications   Medication Sig Start Date End Date Taking? Authorizing Provider  amLODipine (NORVASC) 10 MG tablet Take 10 mg by mouth daily.   Yes [provider]  atorvastatin (LIPITOR) 20 MG tablet Take by mouth. 05/19/19  Yes [provider]  diclofenac (VOLTAREN) 75 MG EC tablet Take by mouth. 09/27/19 10/27/19 Yes [provider]  hydrochlorothiazide (HYDRODIURIL) 25 MG tablet Take by mouth. 05/05/19  Yes [provider]  lisinopril (ZESTRIL) 20 MG tablet Take 20 mg by mouth daily. 05/05/19  Yes [provider]  metFORMIN (GLUCOPHAGE) 1000 MG tablet Take 1,000 mg by mouth 2 (two) times daily with a meal.   Yes [provider]  topiramate (TOPAMAX) 25 MG tablet Take 25 mg by mouth every  evening. 05/23/19  Yes [provider]  traMADol (ULTRAM) 50 MG tablet TAKE ONE TABLET BY MOUTH EVERY 6 HOURS AS NEEDED FOR UP TO 5 DAYS, MAY TAKE TWO FOR MORE SEVERE PAIN 08/25/19  Yes [provider]  acetaminophen (TYLENOL) 500 MG tablet Take by mouth.    [provider]  cephALEXin (KEFLEX) 500 MG capsule Take 1 capsule (500 mg total) by mouth 2 (two) times daily for 7 days. 10/12/19 10/19/19  Arlyn Dunning, PA-C  HYDROcodone-acetaminophen (NORCO/VICODIN) 5-325 MG tablet Take 1 tablet by mouth every 6 (six) hours as needed for up to 2 days for severe pain. 10/12/19 10/14/19  Arlyn Dunning, PA-C  ibuprofen (ADVIL,MOTRIN) 600 MG tablet Take 1 tablet (600 mg total) by mouth every 6 (six) hours as needed. 02/19/18   Bethel Born, PA-C  ondansetron (ZOFRAN) 4 MG tablet Take 1 tablet (4 mg total) by mouth every 6 (six) hours. 10/12/19   Arlyn Dunning, PA-C  tamsulosin (FLOMAX) 0.4 MG CAPS capsule Take 1 capsule (0.4 mg total) by mouth daily after supper for 7 days. 10/12/19 10/19/19  Ronnie Doss A, PA-C  tiZANidine (ZANAFLEX) 4 MG capsule Take 4 mg by mouth 3 (three) times daily. 09/28/19   [provider]    Allergies    Patient has no known allergies.  Review of Systems   Review of Systems  Constitutional: Negative for appetite change,  fatigue and fever.  HENT: Negative for congestion and sore throat.   Respiratory: Negative for cough and shortness of breath.   Gastrointestinal: Positive for abdominal pain. Negative for nausea and vomiting.  Genitourinary: Positive for urgency. Negative for decreased urine volume, dysuria, flank pain, frequency and genital sores.  Musculoskeletal: Positive for back pain.  Skin: Negative for rash.  Neurological: Negative for dizziness.    Physical Exam Updated Vital Signs BP (!) 170/95 (BP Location: Left Arm)   Pulse 85   Resp 17   Ht 6\' 4"  (1.93 m)   Wt (!) 143.3 kg   SpO2 98%   BMI 38.46 kg/m   Physical  Exam Vitals and nursing note reviewed.  Constitutional:      General: He is not in acute distress.    Appearance: Normal appearance. He is obese. He is not ill-appearing, toxic-appearing or diaphoretic.  HENT:     Head: Normocephalic.     Mouth/Throat:     Mouth: Mucous membranes are moist.  Eyes:     Conjunctiva/sclera: Conjunctivae normal.  Pulmonary:     Effort: Pulmonary effort is normal.     Breath sounds: Normal breath sounds.  Abdominal:     Tenderness: There is abdominal tenderness (LLQ). There is left CVA tenderness.  Skin:    General: Skin is warm.  Neurological:     Mental Status: He is alert.  Psychiatric:        Mood and Affect: Mood normal.     ED Results / Procedures / Treatments   Labs (all labs ordered are listed, but only abnormal results are displayed) Labs Reviewed  URINALYSIS, ROUTINE W REFLEX MICROSCOPIC - Abnormal; Notable for the following components:      Result Value   Glucose, UA >=500 (*)    Hgb urine dipstick SMALL (*)    All other components within normal limits  BASIC METABOLIC PANEL - Abnormal; Notable for the following components:   Sodium 133 (*)    CO2 21 (*)    Glucose, Bld 326 (*)    All other components within normal limits  CBC WITH DIFFERENTIAL/PLATELET - Abnormal; Notable for the following components:   WBC 11.2 (*)    Neutro Abs 8.9 (*)    All other components within normal limits  URINALYSIS, MICROSCOPIC (REFLEX) - Abnormal; Notable for the following components:   Bacteria, UA RARE (*)    All other components within normal limits  URINE CULTURE    EKG None  Radiology No results found.  Procedures Procedures (including critical care time)  Medications Ordered in ED Medications  sodium chloride 0.9 % bolus 1,000 mL (0 mLs Intravenous Stopped 10/12/19 1305)  ketorolac (TORADOL) 15 MG/ML injection 15 mg (15 mg Intravenous Given 10/12/19 1145)  ondansetron (ZOFRAN) injection 4 mg (4 mg Intravenous Given 10/12/19 1145)    morphine 4 MG/ML injection 4 mg (4 mg Intravenous Given 10/12/19 1321)  tamsulosin (FLOMAX) capsule 0.4 mg (0.4 mg Oral Given 10/12/19 1325)  cefTRIAXone (ROCEPHIN) 1 g in sodium chloride 0.9 % 100 mL IVPB (1 g Intravenous New Bag/Given 10/12/19 1325)    ED Course  I have reviewed the triage vital signs and the nursing notes.  Pertinent labs & imaging results that were available during my care of the patient were reviewed by me and considered in my medical decision making (see chart for details).  Clinical Course as of Oct 11 1409  Wed Oct 12, 2019  1302 Patient with acute L flank pain for  3 hrs. Hx kidney stones. Workup reveals Approximately 2 cm of stacked stones lead up to the LEFT vesicoureteral junction. Mild to moderate hydroureter and hydronephrosis on the LEFT.  Patient has pain controlled and appears well. Few bacteria in urine and white count is only 11.2. Will treat with rocephin and also culture urine. F/u with urologist. Patient advised on very strict return precautions   [KM]    Clinical Course User Index [KM] Jeral Pinch   MDM Rules/Calculators/A&P                      Based on review of vitals, medical screening exam, lab work and/or imaging, there does not appear to be an acute, emergent etiology for the patient's symptoms. Counseled pt on good return precautions and encouraged both PCP and ED follow-up as needed.  Prior to discharge, I also discussed incidental imaging findings with patient in detail and advised appropriate, recommended follow-up in detail.  Clinical Impression: 1. Kidney stone     Disposition: Discharge  Prior to providing a prescription for a controlled substance, I independently reviewed the patient's recent prescription history on the [place] Controlled Substance Reporting System. The patient had no recent or regular prescriptions and was deemed appropriate for a brief, less than 3 day prescription of narcotic for acute  analgesia.  This note was prepared with assistance of Conservation officer, historic buildings. Occasional wrong-word or sound-a-like substitutions may have occurred due to the inherent limitations of voice recognition software.  Final Clinical Impression(s) / ED Diagnoses Final diagnoses:  Kidney stone    Rx / DC Orders ED Discharge Orders         Ordered    tamsulosin (FLOMAX) 0.4 MG CAPS capsule  Daily after supper     10/12/19 1403    cephALEXin (KEFLEX) 500 MG capsule  2 times daily     10/12/19 1403    HYDROcodone-acetaminophen (NORCO/VICODIN) 5-325 MG tablet  Every 6 hours PRN     10/12/19 1403    ondansetron (ZOFRAN) 4 MG tablet  Every 6 hours     10/12/19 1410           Jeral Pinch 10/12/19 1411    Raeford Razor, MD 10/13/19 1115

## 2019-10-13 LAB — URINE CULTURE: Culture: NO GROWTH

## 2020-11-23 ENCOUNTER — Other Ambulatory Visit: Payer: Self-pay

## 2020-11-23 ENCOUNTER — Emergency Department (HOSPITAL_BASED_OUTPATIENT_CLINIC_OR_DEPARTMENT_OTHER)
Admission: EM | Admit: 2020-11-23 | Discharge: 2020-11-23 | Disposition: A | Discharge status: Home / Self Care | Payer: PRIVATE HEALTH INSURANCE | Attending: Emergency Medicine | Admitting: Emergency Medicine

## 2020-11-23 ENCOUNTER — Emergency Department (HOSPITAL_BASED_OUTPATIENT_CLINIC_OR_DEPARTMENT_OTHER): Payer: PRIVATE HEALTH INSURANCE

## 2020-11-23 ENCOUNTER — Encounter (HOSPITAL_BASED_OUTPATIENT_CLINIC_OR_DEPARTMENT_OTHER): Payer: Self-pay | Admitting: *Deleted

## 2020-11-23 DIAGNOSIS — E119 Type 2 diabetes mellitus without complications: Secondary | ICD-10-CM | POA: Diagnosis not present

## 2020-11-23 DIAGNOSIS — Z7984 Long term (current) use of oral hypoglycemic drugs: Secondary | ICD-10-CM | POA: Diagnosis not present

## 2020-11-23 DIAGNOSIS — I10 Essential (primary) hypertension: Secondary | ICD-10-CM | POA: Diagnosis not present

## 2020-11-23 DIAGNOSIS — S9031XA Contusion of right foot, initial encounter: Secondary | ICD-10-CM

## 2020-11-23 DIAGNOSIS — W208XXA Other cause of strike by thrown, projected or falling object, initial encounter: Secondary | ICD-10-CM | POA: Insufficient documentation

## 2020-11-23 DIAGNOSIS — Z96642 Presence of left artificial hip joint: Secondary | ICD-10-CM | POA: Insufficient documentation

## 2020-11-23 DIAGNOSIS — Z79899 Other long term (current) drug therapy: Secondary | ICD-10-CM | POA: Diagnosis not present

## 2020-11-23 DIAGNOSIS — S99921A Unspecified injury of right foot, initial encounter: Secondary | ICD-10-CM | POA: Diagnosis present

## 2020-11-23 NOTE — ED Provider Notes (Signed)
[facility] EMERGENCY DEPARTMENT Provider Note   CSN: [id number] Arrival date & time: 11/23/20  1510     History Chief Complaint  Patient presents with  . Foot Injury    Dustin Nelson is a 55 y.o. male with past medical history of HTN and DM who presents to the ED with complaints of right foot pain.  On my examination, patient reports that a large mag flashlight fell from a shelf and landed directly on his right foot exactly 1 week ago today.  He states that he has not seen anyone and was hoping that his symptoms would improve, however the swelling and discomfort has continued to persist.  He has tried taking Tylenol and Aleve, without any significant relief.  He is still ambulatory, albeit with discomfort.  He endorses severe diabetic neuropathy, unchanged in the past week.  He does not take any nerve blockers.  He admits that he has not followed up with his primary care provider for a while and attributes it to poor medical insurance.  Denies any wounds, blood thinners, numbness weakness, difficulty ambulating, fevers or chills, history of clots or clotting disorder, or other symptoms.   HPI     Past Medical History:  Diagnosis Date  . Diabetes mellitus without complication (HCC)   . Hypertension   . Kidney stones     There are no problems to display for this patient.   Past Surgical History:  Procedure Laterality Date  . TOTAL HIP ARTHROPLASTY Left        No family history on file.  Social History   Tobacco Use  . Smoking status: Never Smoker  . Smokeless tobacco: Never Used  Vaping Use  . Vaping Use: Never used  Substance Use Topics  . Alcohol use: Never  . Drug use: Never    Home Medications Prior to Admission medications   Medication Sig Start Date End Date Taking? Authorizing Provider  acetaminophen (TYLENOL) 500 MG tablet Take by mouth.   Yes [provider]  ibuprofen (ADVIL,MOTRIN) 600 MG tablet Take 1 tablet (600 mg total) by  mouth every 6 (six) hours as needed. 02/19/18  Yes Bethel Born, PA-C  amLODipine (NORVASC) 10 MG tablet Take 10 mg by mouth daily.    [provider]  atorvastatin (LIPITOR) 20 MG tablet Take by mouth. 05/19/19   [provider]  hydrochlorothiazide (HYDRODIURIL) 25 MG tablet Take by mouth. 05/05/19   [provider]  lisinopril (ZESTRIL) 20 MG tablet Take 20 mg by mouth daily. 05/05/19   [provider]  metFORMIN (GLUCOPHAGE) 1000 MG tablet Take 1,000 mg by mouth 2 (two) times daily with a meal.    [provider]  ondansetron (ZOFRAN) 4 MG tablet Take 1 tablet (4 mg total) by mouth every 6 (six) hours. 10/12/19   Ronnie Doss A, PA-C  tiZANidine (ZANAFLEX) 4 MG capsule Take 4 mg by mouth 3 (three) times daily. 09/28/19   [provider]  topiramate (TOPAMAX) 25 MG tablet Take 25 mg by mouth every evening. 05/23/19   [provider]  traMADol (ULTRAM) 50 MG tablet TAKE ONE TABLET BY MOUTH EVERY 6 HOURS AS NEEDED FOR UP TO 5 DAYS, MAY TAKE TWO FOR MORE SEVERE PAIN 08/25/19   [provider]    Allergies    Patient has no known allergies.  Review of Systems   Review of Systems  All other systems reviewed and are negative.   Physical Exam Updated Vital Signs BP Marland Kitchen)  203/127 (BP Location: Right Arm)   Pulse (!) 108   Temp 98.6 F (37 C) (Oral)   Resp 20   Ht 6\' 4"  (1.93 m)   Wt (!) 140.4 kg   SpO2 97%   BMI 37.67 kg/m   Physical Exam Vitals and nursing note reviewed. Exam conducted with a chaperone present.  Constitutional:      General: He is not in acute distress.    Appearance: Normal appearance. He is not toxic-appearing.  HENT:     Head: Normocephalic and atraumatic.  Eyes:     General: No scleral icterus.    Conjunctiva/sclera: Conjunctivae normal.  Cardiovascular:     Rate and Rhythm: Normal rate.     Pulses: Normal pulses.  Pulmonary:     Effort: Pulmonary effort is normal. No respiratory  distress.  Musculoskeletal:        General: Swelling, tenderness and deformity present.     Comments: Right foot: Swelling over the foot, most notably over distal region near MTP joints.  Ecchymoses and tenderness most notable over distal second and third metatarsals and over those MTP joints.  He is able to wiggle his toes.  Sensitivity to light touch over plantar aspect of foot (chronic).  Sensation intact throughout.  Pedal pulse intact and symmetric with contralateral foot.  No induration.  No wounds. Right ankle: ROM intact.  Skin:    General: Skin is dry.  Neurological:     Mental Status: He is alert.     GCS: GCS eye subscore is 4. GCS verbal subscore is 5. GCS motor subscore is 6.  Psychiatric:        Mood and Affect: Mood normal.        Behavior: Behavior normal.        Thought Content: Thought content normal.     ED Results / Procedures / Treatments   Labs (all labs ordered are listed, but only abnormal results are displayed) Labs Reviewed - No data to display  EKG None  Radiology DG Foot Complete Right  Result Date: 11/23/2020 CLINICAL DATA:  Forefoot pain and swelling following injury 1 week ago. EXAM: RIGHT FOOT COMPLETE - 3+ VIEW COMPARISON:  None. FINDINGS: The mineralization and alignment are normal. There is no evidence of acute fracture or dislocation. There is dorsal forefoot soft tissue swelling without evidence of foreign body or soft tissue emphysema. Both sesamoids of the 1st metatarsal are bipartite. IMPRESSION: Dorsal forefoot soft tissue swelling. No evidence of acute fracture or dislocation. Electronically Signed   By: 01/23/2021 M.D.   On: 11/23/2020 16:13    Procedures Procedures   Medications Ordered in ED Medications - No data to display  ED Course  I have reviewed the triage vital signs and the nursing notes.  Pertinent labs & imaging results that were available during my care of the patient were reviewed by me and considered in my medical  decision making (see chart for details).    MDM Rules/Calculators/A&P                          Jakim Drapeau was evaluated in Emergency Department on 11/23/2020 for the symptoms described in the history of present illness. He was evaluated in the context of the global COVID-19 pandemic, which necessitated consideration that the patient might be at risk for infection with the SARS-CoV-2 virus that causes COVID-19. Institutional protocols and algorithms that pertain to the evaluation of patients at risk for  COVID-19 are in a state of rapid change based on information released by regulatory bodies including the CDC and federal and state organizations. These policies and algorithms were followed during the patient's care in the ED.  I personally reviewed patient's medical chart and all notes from triage and staff during today's encounter. I have also ordered and reviewed all labs and imaging that I felt to be medically necessary in the evaluation of this patient's complaints and with consideration of their physical exam. If needed, translation services were available and utilized.   Plain films are personally reviewed which demonstrate dorsal forefoot soft tissue swelling, but no evidence of fracture or dislocation.  Patient is functionally and neurovascularly intact.  While there is bruising and swelling as a result of his injury to right foot, he is ambulatory.  He was concerned for fracture and is reassured by negative x-ray.  He can and should follow-up with his primary care provider regarding today's ED encounter.  This is especially true given that he is elevated blood pressure and may require adjustment if he continues to have high readings.  Suspect that pain symptoms may be partially contributing factor.    Patient with elevated BP today. Unlikely to be related to CC and patient is without symptoms concerning for end organ damage. I discussed with patient their elevated blood pressure and need for  close outpatient management of their hypertension. Patient counseled on long-term effects of elevated BP including kidney damage, vascular damage, retinopathy, and risk of stroke and other dangerous outcomes. Patient understanding of close PCP follow up.   Final Clinical Impression(s) / ED Diagnoses Final diagnoses:  Contusion of right foot, initial encounter    Rx / DC Orders ED Discharge Orders    None       Lorelee New, PA-C 11/23/20 1711    Maia Plan, MD 11/25/20 [phone]

## 2020-11-23 NOTE — ED Triage Notes (Signed)
He dropped a mag light on his right foot a week ago. Pain and swelling continue.

## 2020-11-23 NOTE — Discharge Instructions (Addendum)
Please follow-up with your primary care provider regarding today's encounter for ongoing evaluation and management.  Your blood pressure is elevated.  Recommend that you obtain a automatic blood pressure cuff over-the-counter to pharmacy and check your blood pressure daily.  I recommend exercise and low-salt diet.  Please follow-up with your primary care provider as you may need medication adjustments if your blood pressures do not improve with lifestyle modifications.  Your x-ray was without any acute fracture or other abnormality.  Suspect that is a foot contusion.  Weightbearing as tolerated.  Use crutches for comfort measures.  I recommend Tylenol and NSAIDs as needed for pain control.  Keep the foot elevated.  Return to the ED or seek immediate medical attention should you experience any new or worsening symptoms.

## 2021-03-08 ENCOUNTER — Other Ambulatory Visit: Payer: Self-pay

## 2021-03-08 ENCOUNTER — Encounter (HOSPITAL_BASED_OUTPATIENT_CLINIC_OR_DEPARTMENT_OTHER): Payer: Self-pay | Admitting: *Deleted

## 2021-03-08 ENCOUNTER — Emergency Department (HOSPITAL_BASED_OUTPATIENT_CLINIC_OR_DEPARTMENT_OTHER): Payer: PRIVATE HEALTH INSURANCE

## 2021-03-08 ENCOUNTER — Emergency Department (HOSPITAL_BASED_OUTPATIENT_CLINIC_OR_DEPARTMENT_OTHER)
Admission: EM | Admit: 2021-03-08 | Discharge: 2021-03-09 | Disposition: A | Discharge status: Home / Self Care | Payer: PRIVATE HEALTH INSURANCE | Attending: Emergency Medicine | Admitting: Emergency Medicine

## 2021-03-08 DIAGNOSIS — Y939 Activity, unspecified: Secondary | ICD-10-CM | POA: Diagnosis not present

## 2021-03-08 DIAGNOSIS — X58XXXA Exposure to other specified factors, initial encounter: Secondary | ICD-10-CM | POA: Diagnosis not present

## 2021-03-08 DIAGNOSIS — I10 Essential (primary) hypertension: Secondary | ICD-10-CM | POA: Insufficient documentation

## 2021-03-08 DIAGNOSIS — Y929 Unspecified place or not applicable: Secondary | ICD-10-CM | POA: Diagnosis not present

## 2021-03-08 DIAGNOSIS — Y999 Unspecified external cause status: Secondary | ICD-10-CM | POA: Insufficient documentation

## 2021-03-08 DIAGNOSIS — E119 Type 2 diabetes mellitus without complications: Secondary | ICD-10-CM | POA: Diagnosis not present

## 2021-03-08 DIAGNOSIS — R079 Chest pain, unspecified: Secondary | ICD-10-CM | POA: Insufficient documentation

## 2021-03-08 DIAGNOSIS — Z79899 Other long term (current) drug therapy: Secondary | ICD-10-CM | POA: Insufficient documentation

## 2021-03-08 DIAGNOSIS — S91302A Unspecified open wound, left foot, initial encounter: Secondary | ICD-10-CM

## 2021-03-08 DIAGNOSIS — S90852A Superficial foreign body, left foot, initial encounter: Secondary | ICD-10-CM | POA: Insufficient documentation

## 2021-03-08 LAB — CBC
HCT: 43.7 % (ref 39.0–52.0)
Hemoglobin: 14.9 g/dL (ref 13.0–17.0)
MCH: 31.4 pg (ref 26.0–34.0)
MCHC: 34.1 g/dL (ref 30.0–36.0)
MCV: 92 fL (ref 80.0–100.0)
Platelets: 188 10*3/uL (ref 150–400)
RBC: 4.75 MIL/uL (ref 4.22–5.81)
RDW: 13 % (ref 11.5–15.5)
WBC: 9.2 10*3/uL (ref 4.0–10.5)
nRBC: 0 % (ref 0.0–0.2)

## 2021-03-08 LAB — TROPONIN I (HIGH SENSITIVITY)
Troponin I (High Sensitivity): 22 ng/L — ABNORMAL HIGH (ref <18)
Troponin I (High Sensitivity): 21 ng/L — ABNORMAL HIGH (ref <18)

## 2021-03-08 LAB — BASIC METABOLIC PANEL
Anion gap: 8 (ref 5–15)
BUN: 14 mg/dL (ref 6–20)
CO2: 25 mmol/L (ref 22–32)
Calcium: 8.5 mg/dL — ABNORMAL LOW (ref 8.9–10.3)
Chloride: 104 mmol/L (ref 98–111)
Creatinine, Ser: 0.66 mg/dL (ref 0.61–1.24)
GFR, Estimated: >60 mL/min (ref >60)
Glucose, Bld: 268 mg/dL — ABNORMAL HIGH (ref 70–99)
Potassium: 3.7 mmol/L (ref 3.5–5.1)
Sodium: 137 mmol/L (ref 135–145)

## 2021-03-08 LAB — D-DIMER, QUANTITATIVE: D-Dimer, Quant: <0.27 ug/mL-FEU (ref 0.00–0.50)

## 2021-03-08 MED ORDER — KETOROLAC TROMETHAMINE 30 MG/ML IJ SOLN
30.0000 mg | Freq: Once | INTRAMUSCULAR | Status: AC
  Administered 2021-03-08: 30 mg via INTRAVENOUS
  Filled 2021-03-08: qty 1

## 2021-03-08 MED ORDER — METFORMIN HCL 500 MG PO TABS
1000.0000 mg | ORAL_TABLET | Freq: Once | ORAL | Status: AC
  Administered 2021-03-08: 1000 mg via ORAL
  Filled 2021-03-08: qty 2

## 2021-03-08 MED ORDER — CEPHALEXIN 500 MG PO CAPS: 500.0000 mg | ORAL_CAPSULE | Freq: Four times a day (QID) | ORAL | 0 refills | Status: DC

## 2021-03-08 MED ORDER — PENTAFLUOROPROP-TETRAFLUOROETH EX AERO
INHALATION_SPRAY | Freq: Once | CUTANEOUS | Status: AC
  Administered 2021-03-08: 30 via TOPICAL
  Filled 2021-03-08: qty 30

## 2021-03-08 MED ORDER — AMLODIPINE BESYLATE 5 MG PO TABS
10.0000 mg | ORAL_TABLET | Freq: Once | ORAL | Status: AC
  Administered 2021-03-08: 10 mg via ORAL
  Filled 2021-03-08: qty 2

## 2021-03-08 MED ORDER — LISINOPRIL 10 MG PO TABS
20.0000 mg | ORAL_TABLET | Freq: Once | ORAL | Status: AC
  Administered 2021-03-08: 20 mg via ORAL
  Filled 2021-03-08: qty 2

## 2021-03-08 MED ORDER — HYDROCHLOROTHIAZIDE 25 MG PO TABS
25.0000 mg | ORAL_TABLET | Freq: Once | ORAL | Status: AC
  Administered 2021-03-08: 25 mg via ORAL
  Filled 2021-03-08: qty 1

## 2021-03-08 MED ORDER — METFORMIN HCL 1000 MG PO TABS: 1000.0000 mg | ORAL_TABLET | Freq: Two times a day (BID) | ORAL | 0 refills | Status: AC

## 2021-03-08 MED ORDER — ACETAMINOPHEN 500 MG PO TABS
1000.0000 mg | ORAL_TABLET | Freq: Once | ORAL | Status: AC
  Administered 2021-03-08: 1000 mg via ORAL
  Filled 2021-03-08: qty 2

## 2021-03-08 MED ORDER — HYDROCHLOROTHIAZIDE 25 MG PO TABS: 25.0000 mg | ORAL_TABLET | Freq: Every day | ORAL | 0 refills | Status: DC

## 2021-03-08 MED ORDER — AMLODIPINE BESYLATE 10 MG PO TABS: 10.0000 mg | ORAL_TABLET | Freq: Every day | ORAL | 0 refills | Status: DC

## 2021-03-08 MED ORDER — LOSARTAN POTASSIUM 50 MG PO TABS: 50.0000 mg | ORAL_TABLET | Freq: Every day | ORAL | 0 refills | Status: DC

## 2021-03-08 MED ORDER — ONDANSETRON HCL 4 MG/2ML IJ SOLN
4.0000 mg | Freq: Once | INTRAMUSCULAR | Status: AC
  Administered 2021-03-08: 4 mg via INTRAVENOUS
  Filled 2021-03-08: qty 2

## 2021-03-08 NOTE — ED Provider Notes (Signed)
[facility] EMERGENCY DEPARTMENT Provider Note   CSN: [id number] Arrival date & time: 03/08/21  1757     History Chief Complaint  Patient presents with   Chest Pain    Dustin Nelson is a 55 y.o. male.  Dustin Nelson is a 55 y.o. male with a hx of hypertension and diabetes, who presents emergency department for evaluation of chest pain and wound to the left foot.  Patient reports yesterday he noticed a wound between his first and second toes on the left foot.  After he had been walking he noticed it was bleeding.  His wife put a dressing on it but with walking he seemed to start bleeding from the wound again.  He reports his feet are already very sensitive and wound is painful.  But he also reports some decreased sensation, which he attributes to his diabetes.  No redness or swelling to the foot.  No fevers or chills.  He also reports today about 3 hours prior to arrival he developed left-sided chest pain.  He reports this is sharp pinpoint pain that seems to be worse with certain movements or when taking a deep breath.  He denies history of similar pain.  Reports pain is still persistent currently.  No associated shortness of breath, lightheadedness or syncope.  No recent long distance travel or surgeries, no history of PE or DVT.  No prior history of MI.  He does report that he has not had his blood pressure or diabetes medications in about 2 months.  Reports his wife has a lot of problems he has been taking care of and he has not been taking care of him self.  No other aggravating relieving factors.  The history is provided by the patient.      Past Medical History:  Diagnosis Date   Diabetes mellitus without complication (HCC)    Hypertension    Kidney stones     There are no problems to display for this patient.   Past Surgical History:  Procedure Laterality Date   TOTAL HIP ARTHROPLASTY Left        No family history on file.  Social History   Tobacco Use    Smoking status: Never   Smokeless tobacco: Never  Vaping Use   Vaping Use: Never used  Substance Use Topics   Alcohol use: Never   Drug use: Never    Home Medications Prior to Admission medications   Medication Sig Start Date End Date Taking? Authorizing Provider  cephALEXin (KEFLEX) 500 MG capsule Take 1 capsule (500 mg total) by mouth 4 (four) times daily. 03/08/21  Yes Dartha Lodge, PA-C  losartan (COZAAR) 50 MG tablet Take 1 tablet (50 mg total) by mouth daily. 03/08/21  Yes Dartha Lodge, PA-C  acetaminophen (TYLENOL) 500 MG tablet Take by mouth.    [provider]  amLODipine (NORVASC) 10 MG tablet Take 1 tablet (10 mg total) by mouth daily. 03/08/21   Dartha Lodge, PA-C  atorvastatin (LIPITOR) 20 MG tablet Take by mouth. 05/19/19   [provider]  hydrochlorothiazide (HYDRODIURIL) 25 MG tablet Take 1 tablet (25 mg total) by mouth daily. 03/08/21   Dartha Lodge, PA-C  ibuprofen (ADVIL,MOTRIN) 600 MG tablet Take 1 tablet (600 mg total) by mouth every 6 (six) hours as needed. 02/19/18   Bethel Born, PA-C  metFORMIN (GLUCOPHAGE) 1000 MG tablet Take 1 tablet (1,000 mg total) by mouth 2 (two) times daily with a meal. 03/08/21  Jodi Geralds N, PA-C  ondansetron (ZOFRAN) 4 MG tablet Take 1 tablet (4 mg total) by mouth every 6 (six) hours. 10/12/19   Ronnie Doss A, PA-C  tiZANidine (ZANAFLEX) 4 MG capsule Take 4 mg by mouth 3 (three) times daily. 09/28/19   [provider]  topiramate (TOPAMAX) 25 MG tablet Take 25 mg by mouth every evening. 05/23/19   [provider]  traMADol (ULTRAM) 50 MG tablet TAKE ONE TABLET BY MOUTH EVERY 6 HOURS AS NEEDED FOR UP TO 5 DAYS, MAY TAKE TWO FOR MORE SEVERE PAIN 08/25/19   [provider]    Allergies    Cantaloupe extract allergy skin test  Review of Systems   Review of Systems  Constitutional:  Negative for chills and fever.  HENT: Negative.    Respiratory:  Negative for cough and shortness of  breath.   Cardiovascular:  Positive for chest pain. Negative for palpitations and leg swelling.  Gastrointestinal:  Negative for abdominal pain, nausea and vomiting.  Musculoskeletal:  Negative for arthralgias and myalgias.  Skin:  Positive for wound.  Neurological:  Negative for weakness and numbness.  All other systems reviewed and are negative.  Physical Exam Updated Vital Signs BP (!) 176/104   Pulse 76   Temp 98.1 F (36.7 C)   Resp 14   Ht 6\' 4"  (1.93 m)   Wt (!) 145.2 kg   SpO2 99%   BMI 38.95 kg/m   Physical Exam Vitals and nursing note reviewed.  Constitutional:      General: He is not in acute distress.    Appearance: Normal appearance. He is well-developed. He is obese. He is not ill-appearing or diaphoretic.  HENT:     Head: Normocephalic and atraumatic.  Eyes:     General:        Right eye: No discharge.        Left eye: No discharge.     Pupils: Pupils are equal, round, and reactive to light.  Cardiovascular:     Rate and Rhythm: Normal rate and regular rhythm.     Pulses: Normal pulses.          Radial pulses are 2+ on the right side and 2+ on the left side.       Dorsalis pedis pulses are 2+ on the right side and 2+ on the left side.     Heart sounds: Normal heart sounds. No murmur heard.   No gallop.  Pulmonary:     Effort: Pulmonary effort is normal. No respiratory distress.     Breath sounds: Normal breath sounds. No wheezing or rales.     Comments: Respirations equal and unlabored, patient able to speak in full sentences, lungs clear to auscultation bilaterally  Chest:     Chest wall: No tenderness.  Abdominal:     General: Bowel sounds are normal. There is no distension.     Palpations: Abdomen is soft. There is no mass.     Tenderness: There is no abdominal tenderness. There is no guarding.     Comments: Abdomen soft, nondistended, nontender to palpation in all quadrants without guarding or peritoneal signs  Musculoskeletal:        General: No  deformity.     Cervical back: Neck supple.     Right lower leg: No tenderness. No edema.     Left lower leg: No tenderness. No edema.     Comments: On the left foot between the first and second toes there is  an area of skin breakdown, no erythema or purulent drainage, there is a small area of the wound that appears to go deeper with visualized small piece of gravel or sand that appears to be stuck in this area.  This seems to be where the wound has been bleeding from.  No crepitus or swelling  Skin:    General: Skin is warm and dry.     Capillary Refill: Capillary refill takes less than 2 seconds.  Neurological:     Mental Status: He is alert and oriented to person, place, and time.     Coordination: Coordination normal.     Comments: Speech is clear, able to follow commands CN III-XII intact Normal strength in upper and lower extremities bilaterally including dorsiflexion and plantar flexion, strong and equal grip strength Sensation normal to light and sharp touch Moves extremities without ataxia, coordination intact  Psychiatric:        Mood and Affect: Mood normal.        Behavior: Behavior normal.    ED Results / Procedures / Treatments   Labs (all labs ordered are listed, but only abnormal results are displayed) Labs Reviewed  BASIC METABOLIC PANEL - Abnormal; Notable for the following components:      Result Value   Glucose, Bld 268 (*)    Calcium 8.5 (*)    All other components within normal limits  TROPONIN I (HIGH SENSITIVITY) - Abnormal; Notable for the following components:   Troponin I (High Sensitivity) 22 (*)    All other components within normal limits  TROPONIN I (HIGH SENSITIVITY) - Abnormal; Notable for the following components:   Troponin I (High Sensitivity) 21 (*)    All other components within normal limits  CBC  D-DIMER, QUANTITATIVE    EKG EKG Interpretation  Date/Time:  Friday March 08 2021 18:10:35 EDT Ventricular Rate:  86 PR Interval:  164 QRS  Duration: 98 QT Interval:  382 QTC Calculation: 457 R Axis:   -18 Text Interpretation: Normal sinus rhythm Septal infarct , age undetermined Abnormal ECG poor data quality previous ecg Confirmed by Linwood DibblesKnapp, Jon [phone]([id number]) on 03/08/2021 8:43:24 PM  Radiology DG Chest 2 View  Result Date: 03/08/2021 CLINICAL DATA:  Chest pain EXAM: CHEST - 2 VIEW COMPARISON:  04/17/2015 FINDINGS: The heart size and mediastinal contours are within normal limits. Both lungs are clear. There are degenerative changes of the spine. IMPRESSION: No active cardiopulmonary disease. Electronically Signed   By: Jasmine PangKim  Fujinaga M.D.   On: 03/08/2021 18:51   DG Foot Complete Left  Result Date: 03/08/2021 CLINICAL DATA:  Wound between first and second toes EXAM: LEFT FOOT - COMPLETE 3+ VIEW COMPARISON:  None. FINDINGS: No fracture or malalignment. No periostitis or bone destruction. No soft tissue emphysema. IMPRESSION: No acute osseous abnormality Electronically Signed   By: Jasmine PangKim  Fujinaga M.D.   On: 03/08/2021 21:02    Procedures .Foreign Body Removal  Date/Time: 03/10/2021 12:34 PM Performed by: Dartha LodgeFord, Ercilia Bettinger N, PA-C Authorized by: Dartha LodgeFord, Briana Newman N, PA-C  Consent: Verbal consent obtained. Consent given by: patient Patient understanding: patient states understanding of the procedure being performed Patient identity confirmed: verbally with patient Body area: skin General location: lower extremity Location details: left foot  Anesthesia: Local Anesthetic: topical anesthetic Patient cooperative: yes Localization method: visualized Removal mechanism: forceps Dressing: dressing applied Depth: subcutaneous Complexity: simple 1 objects recovered. Objects recovered: small piece of gravle or sand Post-procedure assessment: foreign body removed Patient tolerance: patient tolerated the procedure well with no  immediate complications    Medications Ordered in ED Medications  amLODipine (NORVASC) tablet 10 mg (10 mg Oral Given  03/08/21 2037)  hydrochlorothiazide (HYDRODIURIL) tablet 25 mg (25 mg Oral Given 03/08/21 2037)  lisinopril (ZESTRIL) tablet 20 mg (20 mg Oral Given 03/08/21 2037)  pentafluoroprop-tetrafluoroeth (GEBAUERS) aerosol (30 application Topical Given 03/08/21 2231)  ondansetron (ZOFRAN) injection 4 mg (4 mg Intravenous Given 03/08/21 2231)  acetaminophen (TYLENOL) tablet 1,000 mg (1,000 mg Oral Given 03/08/21 2231)  metFORMIN (GLUCOPHAGE) tablet 1,000 mg (1,000 mg Oral Given 03/08/21 2231)  ketorolac (TORADOL) 30 MG/ML injection 30 mg (30 mg Intravenous Given 03/08/21 2336)    ED Course  I have reviewed the triage vital signs and the nursing notes.  Pertinent labs & imaging results that were available during my care of the patient were reviewed by me and considered in my medical decision making (see chart for details).    MDM Rules/Calculators/A&P                          Patient presents to the emergency department with chest pain and a wound to the left foot. Patient nontoxic appearing, in no apparent distress, vitals significant for hypertension, patient has not taken his blood pressure medication in at least 2 months. F chest pain is sharp and well localized, seems very atypical.  Does have a pleuritic character.  No significant findings on physical exam regarding chest pain.  Patient does have an area of skin breakdown between the first and second toes on the left foot, patient is diabetic, currently no erythema or drainage, there is an area where there appears to be a small foreign body that I suspect is causing bleeding will attempt to remove foreign body to improve pain and then apply dressing and treat with Keflex.  Patient will need podiatry follow-up.  DDX: ACS, pulmonary embolism, dissection, pneumothorax, pneumonia, arrhythmia, severe anemia, MSK, GERD, anxiety, abdominal process.   Additional history obtained:  Additional history obtained from chart review & nursing note review.   EKG:  Normal sinus rhythm without significant changes  Lab Tests:  I Ordered, reviewed, and interpreted labs, which included:  CBC: No leukocytosis, normal hemoglobin BMP: Glucose 268 but no other significant electrolyte derangements and normal renal function Troponin: Initial troponin mildly elevated at 22, but delta troponin flat at 21, reassuring trend, low suspicion for ACS D-dimer: Neg  Imaging Studies ordered:  I ordered imaging studies which included CXR, I independently reviewed, formal radiology impression shows:  No active cardiopulmonary disease  ED Course:   EKG without obvious acute ischemia, delta troponin negative, doubt ACS. Patient has negative D-dimer, doubt pulmonary embolism. Pain is not a tearing sensation, symmetric pulses, no widening of mediastinum on CXR, doubt dissection. Cardiac monitor reviewed, no notable arrhythmias or tachycardia. Patient has appeared hemodynamically stable throughout ER visit and appears safe for discharge with close PCP/cardiology follow up. I discussed results, treatment plan, need for PCP follow-up, and return precautions with the patient.  I have prescribed patient's blood pressure medications and metformin for him to restart on.  I have also given referral for podiatry regarding foot wound, dressing applied, discussed appropriate wound care and monitoring, Keflex prescribed for potential infection.  Provided opportunity for questions, patient confirmed understanding and is in agreement with plan.    Portions of this note were generated with Scientist, clinical (histocompatibility and immunogenetics). Dictation errors may occur despite best attempts at proofreading.    Final Clinical Impression(s) / ED  Diagnoses Final diagnoses:  Left-sided chest pain  Open wound of left foot, initial encounter    Rx / DC Orders ED Discharge Orders          Ordered    amLODipine (NORVASC) 10 MG tablet  Daily        03/08/21 2335    hydrochlorothiazide (HYDRODIURIL) 25 MG tablet  Daily         03/08/21 2335    losartan (COZAAR) 50 MG tablet  Daily        03/08/21 2335    metFORMIN (GLUCOPHAGE) 1000 MG tablet  2 times daily with meals        03/08/21 2335    cephALEXin (KEFLEX) 500 MG capsule  4 times daily        03/08/21 2335             Dartha Lodge, PA-C 03/10/21 1241    Linwood Dibbles, MD 03/12/21 0700

## 2021-03-08 NOTE — ED Notes (Signed)
Pt reports wound between legt great toe and digit 2 that began pouring blood yesterday.

## 2021-03-08 NOTE — ED Triage Notes (Signed)
C/o left sided chest pain x 3 hrs , denies sob n/v , also c/o wound to bottom of left foot x 2 days , HX DM

## 2021-03-08 NOTE — Discharge Instructions (Addendum)
Take Keflex 4 times daily for wound between your first and second toes.  Soak in warm water at least twice a day and change dressing regularly.  You will need to follow-up with podiatry.  Your chest pain work-up today was very reassuring.  It is important that you begin taking your blood pressure and diabetes medications regularly.  I have sent in prescriptions for all of these medications.  You need to follow-up closely with your PCP to see if these medications need to be changed or adjusted.  Please return for any new or worsening symptoms.

## 2021-04-11 ENCOUNTER — Other Ambulatory Visit: Payer: Self-pay

## 2021-04-11 ENCOUNTER — Emergency Department (HOSPITAL_BASED_OUTPATIENT_CLINIC_OR_DEPARTMENT_OTHER)
Admission: EM | Admit: 2021-04-11 | Discharge: 2021-04-11 | Disposition: A | Discharge status: Home / Self Care | Payer: PRIVATE HEALTH INSURANCE | Attending: Emergency Medicine | Admitting: Emergency Medicine

## 2021-04-11 ENCOUNTER — Encounter (HOSPITAL_BASED_OUTPATIENT_CLINIC_OR_DEPARTMENT_OTHER): Payer: Self-pay | Admitting: *Deleted

## 2021-04-11 DIAGNOSIS — E119 Type 2 diabetes mellitus without complications: Secondary | ICD-10-CM | POA: Diagnosis not present

## 2021-04-11 DIAGNOSIS — Z7984 Long term (current) use of oral hypoglycemic drugs: Secondary | ICD-10-CM | POA: Insufficient documentation

## 2021-04-11 DIAGNOSIS — R509 Fever, unspecified: Secondary | ICD-10-CM | POA: Diagnosis present

## 2021-04-11 DIAGNOSIS — U071 COVID-19: Secondary | ICD-10-CM | POA: Diagnosis not present

## 2021-04-11 DIAGNOSIS — I10 Essential (primary) hypertension: Secondary | ICD-10-CM | POA: Diagnosis not present

## 2021-04-11 DIAGNOSIS — Z79899 Other long term (current) drug therapy: Secondary | ICD-10-CM | POA: Insufficient documentation

## 2021-04-11 DIAGNOSIS — R000 Tachycardia, unspecified: Secondary | ICD-10-CM | POA: Diagnosis not present

## 2021-04-11 DIAGNOSIS — Z96642 Presence of left artificial hip joint: Secondary | ICD-10-CM | POA: Diagnosis not present

## 2021-04-11 LAB — CBC WITH DIFFERENTIAL/PLATELET
Abs Immature Granulocytes: 0.03 10*3/uL (ref 0.00–0.07)
Basophils Absolute: 0 10*3/uL (ref 0.0–0.1)
Basophils Relative: 1 %
Eosinophils Absolute: 0 10*3/uL (ref 0.0–0.5)
Eosinophils Relative: 1 %
HCT: 41.7 % (ref 39.0–52.0)
Hemoglobin: 14.2 g/dL (ref 13.0–17.0)
Immature Granulocytes: 1 %
Lymphocytes Relative: 10 %
Lymphs Abs: 0.4 10*3/uL — ABNORMAL LOW (ref 0.7–4.0)
MCH: 31.3 pg (ref 26.0–34.0)
MCHC: 34.1 g/dL (ref 30.0–36.0)
MCV: 92.1 fL (ref 80.0–100.0)
Monocytes Absolute: 0.6 10*3/uL (ref 0.1–1.0)
Monocytes Relative: 14 %
Neutro Abs: 3.2 10*3/uL (ref 1.7–7.7)
Neutrophils Relative %: 73 %
Platelets: 126 10*3/uL — ABNORMAL LOW (ref 150–400)
RBC: 4.53 MIL/uL (ref 4.22–5.81)
RDW: 12.9 % (ref 11.5–15.5)
WBC: 4.3 10*3/uL (ref 4.0–10.5)
nRBC: 0 % (ref 0.0–0.2)

## 2021-04-11 LAB — COMPREHENSIVE METABOLIC PANEL
ALT: 58 U/L — ABNORMAL HIGH (ref 0–44)
AST: 38 U/L (ref 15–41)
Albumin: 4 g/dL (ref 3.5–5.0)
Alkaline Phosphatase: 100 U/L (ref 38–126)
Anion gap: 10 (ref 5–15)
BUN: 12 mg/dL (ref 6–20)
CO2: 24 mmol/L (ref 22–32)
Calcium: 8.4 mg/dL — ABNORMAL LOW (ref 8.9–10.3)
Chloride: 102 mmol/L (ref 98–111)
Creatinine, Ser: 0.74 mg/dL (ref 0.61–1.24)
GFR, Estimated: >60 mL/min (ref >60)
Glucose, Bld: 315 mg/dL — ABNORMAL HIGH (ref 70–99)
Potassium: 3.6 mmol/L (ref 3.5–5.1)
Sodium: 136 mmol/L (ref 135–145)
Total Bilirubin: 0.5 mg/dL (ref 0.3–1.2)
Total Protein: 6.5 g/dL (ref 6.5–8.1)

## 2021-04-11 LAB — CBG MONITORING, ED: Glucose-Capillary: 272 mg/dL — ABNORMAL HIGH (ref 70–99)

## 2021-04-11 MED ORDER — PAXLOVID (300/100) 20 X 150 MG & 10 X 100MG PO TBPK: ORAL_TABLET | ORAL | 0 refills | Status: DC

## 2021-04-11 MED ORDER — PROMETHAZINE-DM 6.25-15 MG/5ML PO SYRP: 5.0000 mL | ORAL_SOLUTION | Freq: Four times a day (QID) | ORAL | 0 refills | Status: DC | PRN

## 2021-04-11 MED ORDER — ONDANSETRON 4 MG PO TBDP: 4.0000 mg | ORAL_TABLET | Freq: Three times a day (TID) | ORAL | 0 refills | Status: AC | PRN

## 2021-04-11 MED ORDER — SODIUM CHLORIDE 0.9 % IV BOLUS
1000.0000 mL | Freq: Once | INTRAVENOUS | Status: AC
  Administered 2021-04-11: 1000 mL via INTRAVENOUS

## 2021-04-11 MED ORDER — ACETAMINOPHEN 325 MG PO TABS
650.0000 mg | ORAL_TABLET | Freq: Once | ORAL | Status: AC
  Administered 2021-04-11: 650 mg via ORAL
  Filled 2021-04-11: qty 2

## 2021-04-11 MED ORDER — ONDANSETRON HCL 4 MG/2ML IJ SOLN
4.0000 mg | Freq: Once | INTRAMUSCULAR | Status: AC
  Administered 2021-04-11: 4 mg via INTRAVENOUS
  Filled 2021-04-11: qty 2

## 2021-04-11 MED ORDER — KETOROLAC TROMETHAMINE 15 MG/ML IJ SOLN
15.0000 mg | Freq: Once | INTRAMUSCULAR | Status: AC
  Administered 2021-04-11: 15 mg via INTRAVENOUS
  Filled 2021-04-11: qty 1

## 2021-04-11 NOTE — Discharge Instructions (Addendum)
Take Paxlovid as prescribed. Continue to support your body with as needed medications such as Tylenol, ibuprofen, fluids, cough medicine. Return to the emergency room if you develop increased difficulty breathing. Return with any new, worsening, or concerning symptoms.

## 2021-04-11 NOTE — ED Triage Notes (Signed)
Covid test was positive has night. Here with c.o fever, headache, chills, fatigue, cough and sore throat.

## 2021-04-11 NOTE — ED Provider Notes (Signed)
[facility] EMERGENCY DEPARTMENT Provider Note   CSN: [id number] Arrival date & time: 04/11/21  1651     History No chief complaint on file.   Dustin Nelson is a 55 y.o. male presenting for evaluation of fever, headache, weakness, cough, sore throat.  Patient states his symptoms began 2 days ago.  He took a COVID test yesterday which was positive.  He tried to do a virtual visit with his PCP today to get Paxil Opyd, however was unable to do so and thus is here in the ER.  He has taken Tylenol, although none today.  Has tried Tessalon and Mucinex without improvement of symptoms.  He reports a history of diabetes, has not checked his blood sugars recently.  Additional history of hypertension.  No history of asthma or COPD.  He does report some mild shortness of breath, worse with exertion.  No chest pain.  No nausea, vomiting, abdominal pain, diarrhea.  HPI     Past Medical History:  Diagnosis Date   Diabetes mellitus without complication (HCC)    Hypertension    Kidney stones     There are no problems to display for this patient.   Past Surgical History:  Procedure Laterality Date   TOTAL HIP ARTHROPLASTY Left        No family history on file.  Social History   Tobacco Use   Smoking status: Never   Smokeless tobacco: Never  Vaping Use   Vaping Use: Never used  Substance Use Topics   Alcohol use: Never   Drug use: Never    Home Medications Prior to Admission medications   Medication Sig Start Date End Date Taking? Authorizing Provider  amLODipine (NORVASC) 10 MG tablet Take 1 tablet (10 mg total) by mouth daily. 03/08/21  Yes Dartha Lodge, PA-C  hydrochlorothiazide (HYDRODIURIL) 25 MG tablet Take 1 tablet (25 mg total) by mouth daily. 03/08/21  Yes Dartha Lodge, PA-C  losartan (COZAAR) 50 MG tablet Take 1 tablet (50 mg total) by mouth daily. 03/08/21  Yes Dartha Lodge, PA-C  metFORMIN (GLUCOPHAGE) 1000 MG tablet Take 1 tablet (1,000 mg total) by  mouth 2 (two) times daily with a meal. 03/08/21  Yes Dartha Lodge, PA-C  nirmatrelvir & ritonavir (PAXLOVID, 300/100,) 20 x 150 MG & 10 x 100MG  TBPK Take as instructed on the package.  GFR greater than 60. 04/11/21  Yes Harlyn Rathmann, PA-C  ondansetron (ZOFRAN ODT) 4 MG disintegrating tablet Take 1 tablet (4 mg total) by mouth every 8 (eight) hours as needed for nausea or vomiting. 04/11/21  Yes Gearld Kerstein, PA-C  promethazine-dextromethorphan (PROMETHAZINE-DM) 6.25-15 MG/5ML syrup Take 5 mLs by mouth 4 (four) times daily as needed for cough. 04/11/21  Yes Anias Bartol, PA-C  acetaminophen (TYLENOL) 500 MG tablet Take by mouth.    [provider]  atorvastatin (LIPITOR) 20 MG tablet Take by mouth. 05/19/19   [provider]  cephALEXin (KEFLEX) 500 MG capsule Take 1 capsule (500 mg total) by mouth 4 (four) times daily. 03/08/21   03/10/21, PA-C  ibuprofen (ADVIL,MOTRIN) 600 MG tablet Take 1 tablet (600 mg total) by mouth every 6 (six) hours as needed. 02/19/18   02/21/18, PA-C  ondansetron (ZOFRAN) 4 MG tablet Take 1 tablet (4 mg total) by mouth every 6 (six) hours. 10/12/19   10/14/19 A, PA-C  tiZANidine (ZANAFLEX) 4 MG capsule Take 4 mg by mouth 3 (three) times daily. 09/28/19   [provider]  topiramate (TOPAMAX) 25 MG tablet Take 25 mg by mouth every evening. 05/23/19   [provider]  traMADol (ULTRAM) 50 MG tablet TAKE ONE TABLET BY MOUTH EVERY 6 HOURS AS NEEDED FOR UP TO 5 DAYS, MAY TAKE TWO FOR MORE SEVERE PAIN 08/25/19   [provider]    Allergies    Cantaloupe extract allergy skin test  Review of Systems   Review of Systems  Constitutional:  Positive for fever.  HENT:  Positive for congestion and sore throat.   Respiratory:  Positive for cough and shortness of breath.   Musculoskeletal:  Positive for myalgias.  Neurological:  Positive for weakness.  All other systems reviewed and are negative.  Physical  Exam Updated Vital Signs BP (!) 180/103   Pulse 94   Temp 99.8 F (37.7 C) (Oral)   Resp (!) 25   Ht 6\' 4"  (1.93 m)   Wt (!) 145.2 kg   SpO2 97%   BMI 38.96 kg/m   Physical Exam Vitals and nursing note reviewed.  Constitutional:      General: He is not in acute distress.    Appearance: Normal appearance.  HENT:     Head: Normocephalic and atraumatic.  Eyes:     Conjunctiva/sclera: Conjunctivae normal.     Pupils: Pupils are equal, round, and reactive to light.  Cardiovascular:     Rate and Rhythm: Regular rhythm. Tachycardia present.     Pulses: Normal pulses.  Pulmonary:     Effort: Pulmonary effort is normal. No respiratory distress.     Breath sounds: Normal breath sounds. No wheezing.     Comments: Speaking in full sentences.  Clear lung sounds in all fields.  Sats stable on room air Abdominal:     General: There is no distension.     Palpations: Abdomen is soft.     Tenderness: There is no abdominal tenderness.  Musculoskeletal:        General: Normal range of motion.     Cervical back: Normal range of motion and neck supple.  Skin:    General: Skin is warm and dry.     Capillary Refill: Capillary refill takes less than 2 seconds.  Neurological:     Mental Status: He is alert and oriented to person, place, and time.  Psychiatric:        Mood and Affect: Mood and affect normal.        Speech: Speech normal.        Behavior: Behavior normal.    ED Results / Procedures / Treatments   Labs (all labs ordered are listed, but only abnormal results are displayed) Labs Reviewed  CBC WITH DIFFERENTIAL/PLATELET - Abnormal; Notable for the following components:      Result Value   Platelets 126 (*)    Lymphs Abs 0.4 (*)    All other components within normal limits  COMPREHENSIVE METABOLIC PANEL - Abnormal; Notable for the following components:   Glucose, Bld 315 (*)    Calcium 8.4 (*)    ALT 58 (*)    All other components within normal limits  CBG MONITORING, ED  - Abnormal; Notable for the following components:   Glucose-Capillary 272 (*)    All other components within normal limits    EKG None  Radiology No results found.  Procedures Procedures   Medications Ordered in ED Medications  sodium chloride 0.9 % bolus 1,000 mL ( Intravenous Stopped 04/11/21 1903)  acetaminophen (TYLENOL) tablet 650 mg (  650 mg Oral Given 04/11/21 1749)  ketorolac (TORADOL) 15 MG/ML injection 15 mg (15 mg Intravenous Given 04/11/21 1758)  ondansetron (ZOFRAN) injection 4 mg (4 mg Intravenous Given 04/11/21 1931)    ED Course  I have reviewed the triage vital signs and the nursing notes.  Pertinent labs & imaging results that were available during my care of the patient were reviewed by me and considered in my medical decision making (see chart for details).    MDM Rules/Calculators/A&P                           Patient presenting for evaluation of viral symptoms in the setting of a known COVID infection.  On exam, patient appears nontoxic.  He is mildly tachycardic, but this is likely secondary to fever.  Pulmonary exam is overall reassuring, sats are stable.  Will check ambulatory sats as patient is more short of breath with ambulation.  Will check basic blood work due to patient's history to ensure no electrolyte abnormality, AKI, or concerning acidosis.  Labs interpreted by me, overall reassuring.  There is hyperglycemia around 300, but no evidence of DKA.  Electrolytes are stable.  Kidney function is good.  Discussed findings with patient.  Discussed taking Paxlovid.  Patient's medication list was checked for drugs that would not interact with Paxlovid.  On discharge, patient was feeling nausea, Zofran given and prescription was sent.  Patient ambulatory without hypoxia.  At this time, patient appears safe for discharge.  Return precautions given.  Patient states he understands and agrees to plan  Final Clinical Impression(s) / ED Diagnoses Final diagnoses:   COVID-19    Rx / DC Orders ED Discharge Orders          Ordered    nirmatrelvir & ritonavir (PAXLOVID, 300/100,) 20 x 150 MG & 10 x 100MG  TBPK        04/11/21 1923    ondansetron (ZOFRAN ODT) 4 MG disintegrating tablet  Every 8 hours PRN        04/11/21 1927    promethazine-dextromethorphan (PROMETHAZINE-DM) 6.25-15 MG/5ML syrup  4 times daily PRN        04/11/21 1927             04/13/21, PA-C 04/11/21 2009    [address] DO 04/11/21 2205

## 2021-04-11 NOTE — ED Notes (Signed)
Ambulated in room, SpO2 97-98%, HR 105-110, no resp distress noted.

## 2024-05-10 ENCOUNTER — Emergency Department (HOSPITAL_COMMUNITY)

## 2024-05-10 ENCOUNTER — Other Ambulatory Visit: Payer: Self-pay

## 2024-05-10 ENCOUNTER — Observation Stay (HOSPITAL_COMMUNITY)
Admission: EM | Admit: 2024-05-10 | Discharge: 2024-05-11 | Disposition: A | Discharge status: Home / Self Care | Attending: Cardiology | Admitting: Cardiology

## 2024-05-10 DIAGNOSIS — Z9861 Coronary angioplasty status: Secondary | ICD-10-CM | POA: Diagnosis not present

## 2024-05-10 DIAGNOSIS — Z7901 Long term (current) use of anticoagulants: Secondary | ICD-10-CM | POA: Insufficient documentation

## 2024-05-10 DIAGNOSIS — I340 Nonrheumatic mitral (valve) insufficiency: Secondary | ICD-10-CM | POA: Diagnosis not present

## 2024-05-10 DIAGNOSIS — R001 Bradycardia, unspecified: Secondary | ICD-10-CM | POA: Insufficient documentation

## 2024-05-10 DIAGNOSIS — Z6838 Body mass index (BMI) 38.0-38.9, adult: Secondary | ICD-10-CM | POA: Insufficient documentation

## 2024-05-10 DIAGNOSIS — G4733 Obstructive sleep apnea (adult) (pediatric): Secondary | ICD-10-CM | POA: Diagnosis not present

## 2024-05-10 DIAGNOSIS — Z7982 Long term (current) use of aspirin: Secondary | ICD-10-CM | POA: Insufficient documentation

## 2024-05-10 DIAGNOSIS — R0789 Other chest pain: Principal | ICD-10-CM | POA: Insufficient documentation

## 2024-05-10 DIAGNOSIS — I10 Essential (primary) hypertension: Secondary | ICD-10-CM

## 2024-05-10 DIAGNOSIS — Z79899 Other long term (current) drug therapy: Secondary | ICD-10-CM | POA: Diagnosis not present

## 2024-05-10 DIAGNOSIS — R079 Chest pain, unspecified: Secondary | ICD-10-CM | POA: Diagnosis present

## 2024-05-10 DIAGNOSIS — E1165 Type 2 diabetes mellitus with hyperglycemia: Secondary | ICD-10-CM | POA: Insufficient documentation

## 2024-05-10 DIAGNOSIS — Z7984 Long term (current) use of oral hypoglycemic drugs: Secondary | ICD-10-CM | POA: Diagnosis not present

## 2024-05-10 DIAGNOSIS — E785 Hyperlipidemia, unspecified: Secondary | ICD-10-CM | POA: Insufficient documentation

## 2024-05-10 DIAGNOSIS — I25118 Atherosclerotic heart disease of native coronary artery with other forms of angina pectoris: Secondary | ICD-10-CM | POA: Diagnosis not present

## 2024-05-10 LAB — CBG MONITORING, ED: Glucose-Capillary: 256 mg/dL — ABNORMAL HIGH (ref 70–99)

## 2024-05-10 LAB — BASIC METABOLIC PANEL WITH GFR
Anion gap: 10 (ref 5–15)
BUN: 14 mg/dL (ref 6–20)
CO2: 21 mmol/L — ABNORMAL LOW (ref 22–32)
Calcium: 8.3 mg/dL — ABNORMAL LOW (ref 8.9–10.3)
Chloride: 106 mmol/L (ref 98–111)
Creatinine, Ser: 0.72 mg/dL (ref 0.61–1.24)
GFR, Estimated: >60 mL/min (ref >60)
Glucose, Bld: 151 mg/dL — ABNORMAL HIGH (ref 70–99)
Potassium: 3.6 mmol/L (ref 3.5–5.1)
Sodium: 137 mmol/L (ref 135–145)

## 2024-05-10 LAB — CBC
HCT: 39 % (ref 39.0–52.0)
Hemoglobin: 12.6 g/dL — ABNORMAL LOW (ref 13.0–17.0)
MCH: 31.6 pg (ref 26.0–34.0)
MCHC: 32.3 g/dL (ref 30.0–36.0)
MCV: 97.7 fL (ref 80.0–100.0)
Platelets: 165 K/uL (ref 150–400)
RBC: 3.99 MIL/uL — ABNORMAL LOW (ref 4.22–5.81)
RDW: 13.2 % (ref 11.5–15.5)
WBC: 8.2 K/uL (ref 4.0–10.5)
nRBC: 0 % (ref 0.0–0.2)

## 2024-05-10 LAB — TROPONIN I (HIGH SENSITIVITY)
Troponin I (High Sensitivity): 10 ng/L (ref <18)
Troponin I (High Sensitivity): 9 ng/L (ref <18)

## 2024-05-10 LAB — BRAIN NATRIURETIC PEPTIDE: B Natriuretic Peptide: 82.8 pg/mL (ref 0.0–100.0)

## 2024-05-10 MED ORDER — FUROSEMIDE 10 MG/ML IJ SOLN
20.0000 mg | Freq: Once | INTRAMUSCULAR | Status: AC
  Administered 2024-05-10: 20 mg via INTRAVENOUS
  Filled 2024-05-10: qty 2

## 2024-05-10 MED ORDER — ATORVASTATIN CALCIUM 80 MG PO TABS
80.0000 mg | ORAL_TABLET | Freq: Every day | ORAL | Status: DC
Start: 2024-05-10 — End: 2024-05-11
  Administered 2024-05-10 – 2024-05-11 (×2): 80 mg via ORAL
  Filled 2024-05-10: qty 1
  Filled 2024-05-10: qty 2

## 2024-05-10 MED ORDER — MORPHINE SULFATE (PF) 4 MG/ML IV SOLN
4.0000 mg | Freq: Once | INTRAVENOUS | Status: AC
  Administered 2024-05-10: 4 mg via INTRAVENOUS
  Filled 2024-05-10: qty 1

## 2024-05-10 MED ORDER — TOPIRAMATE 25 MG PO TABS
100.0000 mg | ORAL_TABLET | Freq: Every day | ORAL | Status: DC
Start: 2024-05-10 — End: 2024-05-11
  Administered 2024-05-10 – 2024-05-11 (×2): 100 mg via ORAL
  Filled 2024-05-10 (×2): qty 4

## 2024-05-10 MED ORDER — LISINOPRIL 20 MG PO TABS
20.0000 mg | ORAL_TABLET | Freq: Every day | ORAL | Status: DC
  Administered 2024-05-10 – 2024-05-11 (×2): 20 mg via ORAL
  Filled 2024-05-10 (×2): qty 1

## 2024-05-10 MED ORDER — INSULIN ASPART 100 UNIT/ML IJ SOLN
0.0000 [IU] | Freq: Three times a day (TID) | INTRAMUSCULAR | Status: DC
  Administered 2024-05-11: 2 [IU] via SUBCUTANEOUS

## 2024-05-10 MED ORDER — INSULIN ASPART 100 UNIT/ML IJ SOLN
0.0000 [IU] | Freq: Every day | INTRAMUSCULAR | Status: DC
  Administered 2024-05-10: 3 [IU] via SUBCUTANEOUS

## 2024-05-10 MED ORDER — CARVEDILOL 12.5 MG PO TABS
25.0000 mg | ORAL_TABLET | Freq: Two times a day (BID) | ORAL | Status: DC
Start: 2024-05-10 — End: 2024-05-11
  Administered 2024-05-10 – 2024-05-11 (×2): 25 mg via ORAL
  Filled 2024-05-10 (×3): qty 2

## 2024-05-10 MED ORDER — AMLODIPINE BESYLATE 5 MG PO TABS
5.0000 mg | ORAL_TABLET | Freq: Every day | ORAL | Status: DC
  Administered 2024-05-10 – 2024-05-11 (×2): 5 mg via ORAL
  Filled 2024-05-10 (×2): qty 1

## 2024-05-10 MED ORDER — FUROSEMIDE 20 MG PO TABS
20.0000 mg | ORAL_TABLET | Freq: Every day | ORAL | Status: DC
Start: 2024-05-11 — End: 2024-05-11
  Administered 2024-05-11: 20 mg via ORAL
  Filled 2024-05-10: qty 1

## 2024-05-10 MED ORDER — TADALAFIL 5 MG PO TABS: 5.0000 mg | ORAL_TABLET | Freq: Every day | ORAL | Status: DC

## 2024-05-10 MED ORDER — POTASSIUM CHLORIDE CRYS ER 10 MEQ PO TBCR
10.0000 meq | EXTENDED_RELEASE_TABLET | Freq: Every day | ORAL | Status: DC
  Administered 2024-05-10 – 2024-05-11 (×2): 10 meq via ORAL
  Filled 2024-05-10 (×2): qty 1

## 2024-05-10 MED ORDER — ONDANSETRON HCL 4 MG/2ML IJ SOLN
4.0000 mg | Freq: Once | INTRAMUSCULAR | Status: AC
  Administered 2024-05-10: 4 mg via INTRAVENOUS
  Filled 2024-05-10: qty 2

## 2024-05-10 MED ORDER — LORAZEPAM 1 MG PO TABS
1.0000 mg | ORAL_TABLET | Freq: Once | ORAL | Status: AC
  Administered 2024-05-10: 1 mg via SUBLINGUAL
  Filled 2024-05-10: qty 1

## 2024-05-10 MED ORDER — DULOXETINE HCL 60 MG PO CPEP
60.0000 mg | ORAL_CAPSULE | Freq: Two times a day (BID) | ORAL | Status: DC
  Administered 2024-05-10 – 2024-05-11 (×2): 60 mg via ORAL
  Filled 2024-05-10: qty 1
  Filled 2024-05-10: qty 2

## 2024-05-10 MED ORDER — CLOPIDOGREL BISULFATE 75 MG PO TABS
75.0000 mg | ORAL_TABLET | Freq: Every day | ORAL | Status: DC
  Administered 2024-05-10 – 2024-05-11 (×2): 75 mg via ORAL
  Filled 2024-05-10 (×2): qty 1

## 2024-05-10 MED ORDER — METFORMIN HCL 500 MG PO TABS
1000.0000 mg | ORAL_TABLET | Freq: Two times a day (BID) | ORAL | Status: DC
  Administered 2024-05-10 – 2024-05-11 (×2): 1000 mg via ORAL
  Filled 2024-05-10 (×3): qty 2

## 2024-05-10 MED ORDER — ASPIRIN 81 MG PO TBEC
81.0000 mg | DELAYED_RELEASE_TABLET | Freq: Every day | ORAL | Status: DC
  Administered 2024-05-10 – 2024-05-11 (×2): 81 mg via ORAL
  Filled 2024-05-10 (×2): qty 1

## 2024-05-10 NOTE — ED Provider Notes (Signed)
 [place] EMERGENCY DEPARTMENT AT [facility] Provider Note   CSN: [id number] Arrival date & time: 05/10/24  1452     Patient presents with: Chest Pain   Dustin Nelson is a 58 y.o. male.    Chest Pain Associated symptoms: shortness of breath      58 year old male with medical history significant for HTN, HLD, CAD/MI, presenting to the emergency department with chest pain.  The patient states that he has had intermittent pain described as a dog sitting on my chest, not quite the elephant that I had when I had my MI that has been intermittent for the past few weeks.  It came on again earlier this morning and has been persistent.  He endorses mild shortness of breath.  He has had increasing bilateral lower extremity swelling, denies any weight gain, endorses worsening pain on exertion. He had 10 mg of IV morphine  with EMS.  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 He was found to have a totally occluded codominant right coronary with a clot burden. He was also found to have a borderline LAD lesion. He had PCI of the right coronary with a good angiographic result. While in the hospital he continued to have chest pain and a relook angiogram while he was still in the hospital a few days later showed a widely patent right coronary with borderline LAD lesion. FFR was performed on the LAD and it was negative.  Thereafter he did not feel well. He had recurrent chest discomfort and frequent dyspnea. This did not seem to be due to Brilinta and he is not on Brilinta now. We tried medication changes and he just did not do well. He went back to the Cath Lab 4 weeks ago and he had an approximate 70% LAD lesion and Dr. Lilian stented that with a good angiographic result. I looked at the images and his left main seemed good, the angiographic result seemed good but there was severe disease of a small first diagonal  present before and after PCI. It appears an angiogram of the right coronary was not performed last month.  Colton is no better. He still feels bad. He tires easily. He gets out of breath easily. He has chest discomfort almost constantly-it is worse with exertion. He might feel just a little better but not much at all. Nitrates exacerbated his chronic headaches. Ranexa has not helped. A trial of Lasix  has not really helped. Again, prior to the MI he really was not having chest discomfort.  Prior to Admission medications   Medication Sig Start Date End Date Taking? Authorizing Provider  acetaminophen  (TYLENOL ) 500 MG tablet Take by mouth.    [provider]  amLODipine  (NORVASC ) 10 MG tablet Take 1 tablet (10 mg total) by mouth daily. 03/08/21   Alva Larraine FALCON, PA-C  atorvastatin  (LIPITOR) 20 MG tablet Take by mouth. 05/19/19   [provider]  cephALEXin  (KEFLEX ) 500 MG capsule Take 1 capsule (500 mg total) by mouth 4 (four) times daily. 03/08/21   Alva Larraine FALCON, PA-C  hydrochlorothiazide  (HYDRODIURIL ) 25 MG tablet Take 1 tablet (25 mg total) by mouth daily. 03/08/21   Kehrli, Kelsey F, PA-C  ibuprofen  (ADVIL ,MOTRIN ) 600 MG tablet Take 1 tablet (600 mg total) by mouth every 6 (six) hours as needed. 02/19/18   Odis Burnard Jansky, PA-C  losartan  (COZAAR ) 50 MG tablet Take 1 tablet (50  mg total) by mouth daily. 03/08/21   Kehrli, Kelsey F, PA-C  metFORMIN  (GLUCOPHAGE ) 1000 MG tablet Take 1 tablet (1,000 mg total) by mouth 2 (two) times daily with a meal. 03/08/21   Alva Larraine FALCON, PA-C  nirmatrelvir & ritonavir  (PAXLOVID , 300/100,) 20 x 150 MG & 10 x 100MG  TBPK Take as instructed on the package.  GFR greater than 60. 04/11/21   Caccavale, Sophia, PA-C  ondansetron  (ZOFRAN  ODT) 4 MG disintegrating tablet Take 1 tablet (4 mg total) by mouth every 8 (eight) hours as needed for nausea or vomiting. 04/11/21   Caccavale, Sophia, PA-C  ondansetron  (ZOFRAN ) 4 MG tablet Take 1 tablet (4 mg total)  by mouth every 6 (six) hours. 10/12/19   Rolan Burnard LABOR, PA-C  promethazine -dextromethorphan (PROMETHAZINE -DM) 6.25-15 MG/5ML syrup Take 5 mLs by mouth 4 (four) times daily as needed for cough. 04/11/21   Caccavale, Sophia, PA-C  tiZANidine (ZANAFLEX) 4 MG capsule Take 4 mg by mouth 3 (three) times daily. 09/28/19   [provider]  topiramate  (TOPAMAX ) 25 MG tablet Take 25 mg by mouth every evening. 05/23/19   [provider]  traMADol (ULTRAM) 50 MG tablet TAKE ONE TABLET BY MOUTH EVERY 6 HOURS AS NEEDED FOR UP TO 5 DAYS, MAY TAKE TWO FOR MORE SEVERE PAIN 08/25/19   [provider]    Allergies: Cantaloupe extract allergy skin test    Review of Systems  Respiratory:  Positive for shortness of breath.   Cardiovascular:  Positive for chest pain and leg swelling.  All other systems reviewed and are negative.   Updated Vital Signs BP (!) 200/100   Pulse 93   Temp 98.5 F (36.9 C) (Oral)   Resp (!) 23   Ht 6' 4 (1.93 m)   Wt (!) 145 kg   SpO2 100%   BMI 38.91 kg/m   Physical Exam Vitals and nursing note reviewed.  Constitutional:      General: He is not in acute distress.    Appearance: He is well-developed.  HENT:     Head: Normocephalic and atraumatic.  Eyes:     Conjunctiva/sclera: Conjunctivae normal.  Cardiovascular:     Rate and Rhythm: Normal rate and regular rhythm.     Heart sounds: No murmur heard. Pulmonary:     Effort: Pulmonary effort is normal. No respiratory distress.     Breath sounds: Normal breath sounds.  Abdominal:     Palpations: Abdomen is soft.     Tenderness: There is no abdominal tenderness.  Musculoskeletal:        General: No swelling.     Cervical back: Neck supple.     Right lower leg: Edema present.     Left lower leg: Edema present.     Comments: 1+ bilateral pitting LE edema  Skin:    General: Skin is warm and dry.     Capillary Refill: Capillary refill takes less than 2 seconds.  Neurological:     Mental  Status: He is alert.  Psychiatric:        Mood and Affect: Mood normal.     (all labs ordered are listed, but only abnormal results are displayed) Labs Reviewed  BASIC METABOLIC PANEL WITH GFR - Abnormal; Notable for the following components:      Result Value   CO2 21 (*)    Glucose, Bld 151 (*)    Calcium  8.3 (*)    All other components within normal limits  CBC - Abnormal; Notable for  the following components:   RBC 3.99 (*)    Hemoglobin 12.6 (*)    All other components within normal limits  BRAIN NATRIURETIC PEPTIDE  TROPONIN I (HIGH SENSITIVITY)  TROPONIN I (HIGH SENSITIVITY)    EKG: EKG Interpretation Date/Time:  Tuesday May 10 2024 15:57:30 EDT Ventricular Rate:  55 PR Interval:  201 QRS Duration:  99 QT Interval:  434 QTC Calculation: 416 R Axis:   9  Text Interpretation: Sinus bradycardia Artifact in leads I and III Anterior infarct, old Confirmed by Jerrol Agent ([id number]) on 05/10/2024 4:08:59 PM  Radiology: [facility] Chest 2 View Result Date: 05/10/2024 CLINICAL DATA:  Chest pain. EXAM: CHEST - 2 VIEW COMPARISON:  03/08/2021 FINDINGS: Normal heart size. Stable tortuosity of the thoracic aorta. There is no evidence of pulmonary edema, consolidation, pneumothorax, nodule or pleural fluid. The visualized skeletal structures are unremarkable. IMPRESSION: No active cardiopulmonary disease. Electronically Signed   By: Marcey Moan M.D.   On: 05/10/2024 16:01     Procedures   Medications Ordered in the ED  LORazepam  (ATIVAN ) tablet 1 mg (has no administration in time range)  ondansetron  (ZOFRAN ) injection 4 mg (has no administration in time range)  furosemide  (LASIX ) injection 20 mg (20 mg Intravenous Given 05/10/24 1556)  morphine  (PF) 4 MG/ML injection 4 mg (4 mg Intravenous Given 05/10/24 1634)  ondansetron  (ZOFRAN ) injection 4 mg (4 mg Intravenous Given 05/10/24 1634)                                    Medical Decision Making Amount and/or Complexity of  Data Reviewed Labs: ordered.  Risk Prescription drug management.    58 year old male with medical history significant for HTN, HLD, CAD/MI, presenting to the emergency department with chest pain.  The patient states that he has had intermittent pain described as a dog sitting on my chest, not quite the elephant that I had when I had my MI that has been intermittent for the past few weeks.  It came on again earlier this morning and has been persistent.  He endorses mild shortness of breath.  He has had increasing bilateral lower extremity swelling, denies any weight gain, endorses worsening pain on exertion.  He had 10 mg of IV morphine  with EMS.  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 He was found to have a totally occluded codominant right coronary with a clot burden. He was also found to have a borderline LAD lesion. He had PCI of the right coronary with a good angiographic result. While in the hospital he continued to have chest pain and a relook angiogram while he was still in the hospital a few days later showed a widely patent right coronary with borderline LAD lesion. FFR was performed on the LAD and it was negative.  Thereafter he did not feel well. He had recurrent chest discomfort and frequent dyspnea. This did not seem to be due to Brilinta and he is not on Brilinta now. We tried medication changes and he just did not do well. He went back to the Cath Lab 4 weeks ago and he had an approximate 70% LAD lesion and Dr. Lilian stented that with a good angiographic result. I looked at the images and his left main seemed good, the angiographic result seemed good but there was severe disease of  a small first diagonal present before and after PCI. It appears an angiogram of the right coronary was not performed last month.  Colton is no better. He still feels bad. He tires easily. He gets out of breath easily. He  has chest discomfort almost constantly-it is worse with exertion. He might feel just a little better but not much at all. Nitrates exacerbated his chronic headaches. Ranexa has not helped. A trial of Lasix  has not really helped. Again, prior to the MI he really was not having chest discomfort.  On arrival, the patient was vitally stable, afebrile, not tachycardic or tachypneic, saturating well in room air, BP 175/88.  Patient presenting with intermittent chest discomfort, anginal symptoms described as a heaviness sensation on his chest with no radiation, mild shortness of breath and lower extremity swelling.  Has followed outpatient and had multiple recent cardiac catheterizations.  Not currently on Brilinta.  EKG: Sinus bradycardia, ventricular rate 5 5, artifact present, no STEMI.  Chest x-ray: No active cardiopulmonary disease.  Labs: Cardiac troponin 10, BNP 83, CBC without a leukocytosis, mild anemia to 12.6, BMP generally unremarkable, mild hyperglycemia 151.  Repeat troponin ordered however patient reports more chronic persistent symptoms with a flareup of an episode earlier today.  Cardiology consulted, spoke with cardiology master who will have someone come evaluate the patient to determine need for further inpatient evaluation. Spoke with Thom Sluder, PA, cardiology to admit the patient.     Final diagnoses:  Chest pain, unspecified type    ED Discharge Orders     None          Jerrol Agent, MD 05/10/24 1751

## 2024-05-10 NOTE — H&P (Addendum)
 Cardiology H&P   Patient ID: Dustin Nelson MRN: [id number]; DOB: [birth year]  Admit date: 05/10/2024 Date of Consult: 05/10/2024  PCP:  Patient, No Pcp Per   [facility] Providers Cardiologist:  None [facility]  Patient Profile: Dustin Nelson is a 58 y.o. 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   History of Present Illness: Dustin Nelson is followed by [facility] cardiology.  He had an inferior MI late May 2025 with totally occluded codominant RCA with successful stent placement.  Echocardiogram with preserved LVEF.  Moderate MR.  Later during admission continued to have chest pain and had relook angiogram that showed widely patent RCA.  He had borderline LAD lesion that was negative by FFR but because he was having recurrent chest discomfort and dyspnea on exertion despite medication changes, changing Brilinta to Plavix  and up titration of antianginals he was taken to the Cath Lab again where he had stenting of his 70% LAD lesion with good results.  He was just seen by his cardiologist 8/21 who reports he had good angiographic results but there was still some severe disease in D1 before and after PCI.  Of note RCA angiogram was not performed on that cardiac catheterization.  Cardiologist summarizes that he has done very poorly post PCI and has had persistent chest discomfort constantly, worth with exertion.  Failing antianginal therapy and did not tolerate nitrates due to headaches, Ranexa was inefficient, Lasix  did not help, colchicine ineffective.  Symptoms actually worse now than prior to his MI.  Also had been reporting some vertigo.  Last office visit carvedilol  was increased to 25 mg twice daily.  Referral was sent to [facility].  Reportedly scheduled for early November.  Currently patient being evaluated for same complaints above with  persistent chest pain.  He was given IV morphine  without any relief, loaded on aspirin .  First troponin negative.  Today he is accompanied with family.  Reports mixed nonexertional/exertional chest pain that he describes as severe heaviness although no radiating pain.  Still currently in pain despite morphine .  He works a Health and safety inspector job at [employer] and feels that he is functionally limited by his chest pain.  He has had significant drop in exercise capacity and finds daily activities are difficult for him to do.  Also has been reporting some peripheral edema though no orthopnea.  Reports mild shortness of breath but he feels this is more related to deconditioning.  Denies any smoking history, drugs or alcohol.  He has been compliant with all of his medications.  BNP 82.  Potassium 3.6.  Creatinine 0.72.  Hemoglobin 12.6.  Past Medical History:  Diagnosis Date   Diabetes mellitus without complication (HCC)    Hypertension    Kidney stones     Past Surgical History:  Procedure Laterality Date   TOTAL HIP ARTHROPLASTY Left      Scheduled Meds:  Continuous Infusions:  PRN Meds:   Allergies:    Allergies  Allergen Reactions   Cantaloupe Extract Allergy Skin Test Swelling    Swelling of tongue Swelling of tongue     Social History:   Social History   Socioeconomic History   Marital status: Married    Spouse name: Not on file   Number of children: Not on file   Years of education: Not on file   Highest education level: Not on  file  Occupational History   Not on file  Tobacco Use   Smoking status: Never   Smokeless tobacco: Never  Vaping Use   Vaping status: Never Used  Substance and Sexual Activity   Alcohol use: Never   Drug use: Never   Sexual activity: Not on file  Other Topics Concern   Not on file  Social History Narrative   Not on file   Social Drivers of Health   Financial Resource Strain: High Risk (11/19/2023)   Received from [facility]   Overall Financial  Resource Strain (CARDIA)    Difficulty of Paying Living Expenses: Very hard  Food Insecurity: No Food Insecurity (01/21/2024)   Received from [facility]   Hunger Vital Sign    Within the past 12 months, you worried that your food would run out before you got the money to buy more.: Never true    Within the past 12 months, the food you bought just didn't last and you didn't have money to get more.: Never true  Transportation Needs: No Transportation Needs (01/21/2024)   Received from [facility] - Transportation    Lack of Transportation (Medical): No    Lack of Transportation (Non-Medical): No  Physical Activity: Unknown (11/19/2023)   Received from [facility]   Exercise Vital Sign    On average, how many days per week do you engage in moderate to strenuous exercise (like a brisk walk)?: 0 days    Minutes of Exercise per Session: Not on file  Stress: No Stress Concern Present (03/15/2024)   Received from [facility] - Occupational Stress Questionnaire    Do you feel stress - tense, restless, nervous, or anxious, or unable to sleep at night because your mind is troubled all the time - these days?: Only a little  Social Connections: Moderately Integrated (11/19/2023)   Received from [facility]   Social Network    How would you rate your social network (family, work, friends)?: Adequate participation with social networks  Intimate Partner Violence: Not At Risk (03/15/2024)   Received from [facility]   HITS    Over the last 12 months how often did your partner physically hurt you?: Never    Over the last 12 months how often did your partner insult you or talk down to you?: Never    Over the last 12 months how often did your partner threaten you with physical harm?: Never    Over the last 12 months how often did your partner scream or curse at you?: Never    Family History:   No family history on file.   ROS:  Please see  the history of present illness.  All other ROS reviewed and negative.     Physical Exam/Data: Vitals:   05/10/24 1456 05/10/24 1500  BP:  (!) 175/88  Pulse:  60  Resp:  16  Temp:  98.5 F (36.9 C)  TempSrc:  Oral  SpO2:  100%  Weight: (!) 145 kg   Height: 6' 4 (1.93 m)    No intake or output data in the 24 hours ending 05/10/24 1729    05/10/2024    2:56 PM 04/11/2021    4:56 PM 03/08/2021    6:02 PM  Last 3 Weights  Weight (lbs) 319 lb 10.7 oz 320 lb 1.7 oz 320 lb  Weight (kg) 145 kg 145.2 kg 145.151 kg     Body mass index  is 38.91 kg/m.  General:  Well nourished, well developed, in no acute distress HEENT: normal Neck: no JVD Vascular: No carotid bruits; Distal pulses 2+ bilaterally Cardiac:  normal S1, S2; RRR; no murmur  Lungs:  clear to auscultation bilaterally, no wheezing, rhonchi or rales  Abd: soft, nontender, no hepatomegaly  Ext: no edema Musculoskeletal:  No deformities, BUE and BLE strength normal and equal Skin: warm and dry  Neuro:  CNs 2-12 intact, no focal abnormalities noted Psych:  Normal affect   EKG:  The EKG was personally reviewed and demonstrates: Sinus no acute ST-T wave changes Telemetry:  Telemetry was personally reviewed and demonstrates: Sinus.  Heart rates in 45-60.  Relevant CV Studies: Cardiac catheterization 03/16/2024  Coronary artery disease (as described below) including significant LAD  disease, lesion appears at least 70% and is eccentric in nature.   - Successful PCI to the LAD with placement of a Synergy DES with good  angiographic result and TIMI 3 flow.   Recommendations:  - Secondary prevention and medical management  - Dual antiplatelet therapy for at least 12 months   Cardiac catheterization 01/26/2024 CORONARY ANGIOGRAPHY: See diagram below.  Coronary Findings Diagnostic Dominance: Right  Left Main: Normal  Left Anterior Descending: Mid LAD lesion is 50% stenosed. Pressure wire/FFR was performed on the lesion. FFR:  0.84. pressure wire/iFR used.IFR: 0.9. First Diagonal Branch: The vessel is small. 1st Diag lesion is 90% stenosed. Second Diagonal Branch: 2nd Diag lesion is 70% stenosed.  Ramus Intermedius: Normal  Left Circumflex: Normal  Right Coronary Artery: Previously placed Prox RCA to Mid RCA drug eluting stent is widely patent. Previous treatment took place <1 month ago.   Cardiac catheterization 01/21/2024 Inferior STEMI due to proximal RCA thrombotic occlusion.  Successful primary PCI with angioplasty, mechanical aspiration, and  insertion of 4.0 x 38 mm drug-eluting stent, postdilated with 4.5 mm and  5.0 mm noncompliant balloons.  Mild plaque with up to 25% narrowing of the LAD proper and distal  circumflex.  Severe branch vessel disease of a small diagonal branch not amenable to  PCI.  Ischemic cardiomyopathy.  LV ejection fraction 45 to 50%.   Plan:  Admit to cardiac ICU.  DAPT with ticagrelor and aspirin .  Advance endocrine therapy for cardiomyopathy.  Echocardiogram.   The imaging is on file and stored in a permanent location.   Echocardiogram 01/22/2024  Left Ventricle Left ventricle size is normal. There is mild concentric hypertrophy. EF: 55-60%. Wall motion is normal. Doppler parameters consistent with mild diastolic dysfunction and low to normal LA pressure.  Right Ventricle Right ventricle size is normal. Systolic function is normal. Normal tricuspid annular plane systolic excursion (TAPSE) >1.7 cm. Normal systolic excursion velocity by TDI (>9.5 cm/s).  Left Atrium Left atrium size is normal. Left atrium volume index is normal (16-34 mL/m2).  Right Atrium Right atrium size is normal.  IVC/SVC The inferior vena cava demonstrates a diameter of <=2.1 cm and collapses >50%; therefore, the right atrial pressure is estimated at 3 mmHg.  Mitral Valve Mitral valve structure is normal. There is mild to moderate regurgitation with a posteriorly directed jet.  Tricuspid  Valve Tricuspid valve structure is normal. There is no regurgitation. Unable to assess RVSP due to incomplete Doppler signal.  Aortic Valve The aortic valve is tricuspid. The leaflets are not thickened and exhibit normal excursion. Mild aortic valve regurgitation. There is no evidence of aortic valve stenosis.  Pulmonic Valve The pulmonic valve was not assessed.  Ascending Aorta  The aortic root is normal in size.  Pericardium There is no pericardial effusion.  Study Details A complete echo was performed using complete 2D, color flow Doppler and spectral Doppler. During the study the apical, parasternal, subcostal and suprasternal views were captured. Overall the study quality was adequate. The imaging is on file and stored in a permanent location. All Measurements   Laboratory Data: High Sensitivity Troponin:   Recent Labs  Lab 05/10/24 1502  TROPONINIHS 10     Chemistry Recent Labs  Lab 05/10/24 1502  NA 137  K 3.6  CL 106  CO2 21*  GLUCOSE 151*  BUN 14  CREATININE 0.72  CALCIUM  8.3*  GFRNONAA >60  ANIONGAP 10    No results for input(s): PROT, ALBUMIN, AST, ALT, ALKPHOS, BILITOT in the last 168 hours. Lipids No results for input(s): CHOL, TRIG, HDL, LABVLDL, LDLCALC, CHOLHDL in the last 168 hours.  Hematology Recent Labs  Lab 05/10/24 1502  WBC 8.2  RBC 3.99*  HGB 12.6*  HCT 39.0  MCV 97.7  MCH 31.6  MCHC 32.3  RDW 13.2  PLT 165   Thyroid No results for input(s): TSH, FREET4 in the last 168 hours.  BNP Recent Labs  Lab 05/10/24 1500  BNP 82.8    DDimer No results for input(s): DDIMER in the last 168 hours.  Radiology/Studies:  DG Chest 2 View Result Date: 05/10/2024 CLINICAL DATA:  Chest pain. EXAM: CHEST - 2 VIEW COMPARISON:  03/08/2021 FINDINGS: Normal heart size. Stable tortuosity of the thoracic aorta. There is no evidence of pulmonary edema, consolidation, pneumothorax, nodule or pleural fluid. The visualized  skeletal structures are unremarkable. IMPRESSION: No active cardiopulmonary disease. Electronically Signed   By: Marcey Moan M.D.   On: 05/10/2024 16:01     Assessment and Plan:  Chest pain CAD Hyperlipidemia Difficult situation.  He has history of inferior MI in May 2025 with stent placed to his RCA.  Since then he has had persistent complaints of chest pain, had relook angiogram during admission which was benign.  Continued to have pain after that again and later taken back to the Cath Lab 02/2024 and had stenting of previously negative FFR LAD lesion.  He has effectively failed or has been intolerant to antianginal therapy.  EKG with no acute ST-T wave changes.  First troponin negative. Continue with DAPT with aspirin  and Plavix , amlodipine  5 mg, atorvastatin  80 mg, carvedilol  25 mg twice daily. Discussed with Dr. Ladona, plan is for treadmill stress test to further define true anginal versus pseudo anginal complaints.   Uptitration of carvedilol  ineffective Switching from Brilinta to Plavix  did not help Did not tolerate Imdur due to headaches Ranexa was not effective He has follow-up with [facility] in November  Mitral regurgitation Echocardiogram 12/2023 demonstrates preserved biventricular function with mild concentric LVH.  Mild to moderate MR with posterior directed jet.  Only has peripheral edema, does not appear to be in CHF exacerbation. Continue with low-dose Lasix  20 mg.  This also has been ineffective in helping his shortness of breath. Also normal BNP would argue against CHF exacerbation  Hypertension Reports generally at home this is around 120 systolic.  Will get more measurements before titrating. Continue in the context above along with lisinopril  20 mg.  Type 2 diabetes A1c uncontrolled.  Continue home meds.   Bradycardia Noted.  Asymptomatic.  Continue beta-blocker.  Of note did not take any of his meds today but first time doing this.    Risk Assessment/Risk  Scores:  Code Status: Full Code  Severity of Illness: The appropriate patient status for this patient is OBSERVATION. Observation status is judged to be reasonable and necessary in order to provide the required intensity of service to ensure the patient's safety. The patient's presenting symptoms, physical exam findings, and initial radiographic and laboratory data in the context of their medical condition is felt to place them at decreased risk for further clinical deterioration. Furthermore, it is anticipated that the patient will be medically stable for discharge from the hospital within 2 midnights of admission.    Signed,  Thom LITTIE Sluder, PA-C 05/10/2024, 5:40 PM

## 2024-05-10 NOTE — ED Notes (Signed)
Pharmacy called to verify pt medications  

## 2024-05-10 NOTE — ED Triage Notes (Signed)
 Pt to the ed from home via ems with CC if chest pain. Pt was reading when chest pain started. Pt relays current dizziness, with out sob. EMS gave 325 of Asprin and 10 mg of morphine  without improvement

## 2024-05-11 ENCOUNTER — Encounter (HOSPITAL_COMMUNITY): Payer: Self-pay | Admitting: Cardiology

## 2024-05-11 ENCOUNTER — Observation Stay (HOSPITAL_COMMUNITY): Admit: 2024-05-11 | Discharge: 2024-05-11 | Disposition: A | Discharge status: Home / Self Care | Attending: Cardiology

## 2024-05-11 DIAGNOSIS — R0789 Other chest pain: Secondary | ICD-10-CM | POA: Diagnosis not present

## 2024-05-11 DIAGNOSIS — G4733 Obstructive sleep apnea (adult) (pediatric): Secondary | ICD-10-CM | POA: Diagnosis not present

## 2024-05-11 DIAGNOSIS — I25118 Atherosclerotic heart disease of native coronary artery with other forms of angina pectoris: Secondary | ICD-10-CM | POA: Diagnosis not present

## 2024-05-11 LAB — BASIC METABOLIC PANEL WITH GFR
Anion gap: 6 (ref 5–15)
BUN: 13 mg/dL (ref 6–20)
CO2: 23 mmol/L (ref 22–32)
Calcium: 8.6 mg/dL — ABNORMAL LOW (ref 8.9–10.3)
Chloride: 107 mmol/L (ref 98–111)
Creatinine, Ser: 0.82 mg/dL (ref 0.61–1.24)
GFR, Estimated: >60 mL/min (ref >60)
Glucose, Bld: 208 mg/dL — ABNORMAL HIGH (ref 70–99)
Potassium: 3.4 mmol/L — ABNORMAL LOW (ref 3.5–5.1)
Sodium: 136 mmol/L (ref 135–145)

## 2024-05-11 LAB — CBC
HCT: 37.9 % — ABNORMAL LOW (ref 39.0–52.0)
Hemoglobin: 12.3 g/dL — ABNORMAL LOW (ref 13.0–17.0)
MCH: 31.5 pg (ref 26.0–34.0)
MCHC: 32.5 g/dL (ref 30.0–36.0)
MCV: 96.9 fL (ref 80.0–100.0)
Platelets: 165 K/uL (ref 150–400)
RBC: 3.91 MIL/uL — ABNORMAL LOW (ref 4.22–5.81)
RDW: 13.2 % (ref 11.5–15.5)
WBC: 8.7 K/uL (ref 4.0–10.5)
nRBC: 0 % (ref 0.0–0.2)

## 2024-05-11 LAB — CBG MONITORING, ED: Glucose-Capillary: 135 mg/dL — ABNORMAL HIGH (ref 70–99)

## 2024-05-11 MED ORDER — AMLODIPINE BESYLATE 10 MG PO TABS: 10.0000 mg | ORAL_TABLET | Freq: Every day | ORAL | Status: AC

## 2024-05-11 NOTE — Discharge Summary (Addendum)
 Physician Discharge Summary      Patient ID: Dustin Nelson MRN: [id number] DOB/AGE: 12/14/1965 58 y.o. Patient, No Pcp Per   Admit date: 05/10/2024 Discharge date: 05/11/2024 0   Primary Discharge Diagnosis Noncardiac musculoskeletal chest pain, chest pain easily reproducible. CAD native vessels, two-vessel with stable angina pectoris Severe OSA not on CPAP Morbid obesity Diabetes mellitus   Significant Diagnostic Studies:  Treadmill exercise stress test 05/11/2024: Patient exercised for 4 minutes and 42 seconds and achieved 6.2 METS on Bruce protocol.   Peak heart rate 103 bpm, 64% of MPHR.  Stress EKG is nondiagnostic for ischemia in view of suboptimal heart rate response, there were no ST-T wave changes of ischemia, no arrhythmias.   Normal blood pressure response. Stress terminated due to chest pain.  Radiology: DG Chest 2 View Result Date: 05/10/2024 CLINICAL DATA:  Chest pain. EXAM: CHEST - 2 VIEW COMPARISON:  03/08/2021 FINDINGS: Normal heart size. Stable tortuosity of the thoracic aorta. There is no evidence of pulmonary edema, consolidation, pneumothorax, nodule or pleural fluid. The visualized skeletal structures are unremarkable. IMPRESSION: No active cardiopulmonary disease. Electronically Signed   By: Marcey Moan M.D.   On: 05/10/2024 16:01    Hospital Course: Dustin Nelson is a 58 y.o. male  patient inferior MI late May 2025 with totally occluded codominant RCA with successful stent placement. Echocardiogram with preserved LVEF. Moderate MR. Later during admission continued to have chest pain and had relook angiogram that showed widely patent RCA. He had borderline LAD lesion that was negative by FFR but because he was having recurrent chest discomfort and dyspnea on exertion despite medication changes, changing Brilinta to Plavix  and up titration of antianginals he was taken to the Cath Lab again where he had stenting of his 70% LAD lesion with good results.    Cardiologist summarizes that he has done very poorly post PCI and has had persistent chest discomfort constantly, worth with exertion. Failing antianginal therapy and did not tolerate nitrates due to headaches, Ranexa was inefficient, Lasix  did not help, colchicine ineffective. Symptoms actually worse now than prior to his MI. Also had been reporting some vertigo.   He was brought into the emergency room after EMS was called for chest pain.  He was ruled out for myocardial infarction, in spite of ongoing continuous chest pain for the last several days, cardiac markers were negative, EKG essentially normal.  Hence at this point with mild chest wall tenderness, chest pain suggestive of musculoskeletal pain, I decided to do treadmill exercise stress test.  Patient exercised on treadmill however was nondiagnostic as he only achieved 64 of MPHR however he had no arrhythmias, no ST changes of ischemia, normal blood pressure response but continued chest discomfort.  I reviewed the stress test personally, discussed with the patient and also called his wife and discussed with her that he had no changes to suggest that his chest pain symptoms are related to coronary artery disease or coronary ischemia.  I have reassured him, patient states that he has never exercised this much in the past before.  I reassured him and stated that he should start exercising and not to worry too much about the chest pain and to follow-up with his cardiologist as usual.  He appears to be convinced.  He also has an appointment to see and get an opinion at [facility]  After review of his medical records, I do feel his workup has been complete and we do suspect that he has significant  anxiety and depression related to recent myocardial infarction as well.  Probably would be helpful to see a psychologist as well.  Recommendations on discharge: Evaluate for noncardiac causes of chest pain.  Reassurance.  Consider  reassurance and psychotherapy.  Strongly recommended that he restart CPAP which will certainly help with endothelial dysfunction.  Resume GLP-1 agonist, extensive discussion regarding slow eating, eating halfway through the meal and stopping to see if satiety is achieved, keeping himself well-hydrated.  Discharge Exam:    05/11/2024   10:15 AM 05/11/2024    6:09 AM 05/11/2024    2:13 AM  Vitals with BMI  Systolic 146 147 889  Diastolic 85 97 66  Pulse 67 64 50     Physical Exam Constitutional:      Appearance: He is morbidly obese.  Neck:     Vascular: No carotid bruit or JVD.  Cardiovascular:     Rate and Rhythm: Normal rate and regular rhythm.     Pulses: Intact distal pulses.     Heart sounds: Normal heart sounds. No murmur heard.    No gallop.  Pulmonary:     Effort: Pulmonary effort is normal.     Breath sounds: Normal breath sounds.  Chest:     Chest wall: Tenderness (left upper chest) present.  Abdominal:     General: Bowel sounds are normal.     Palpations: Abdomen is soft.  Musculoskeletal:     Right lower leg: No edema.     Left lower leg: No edema.    Labs:   Lab Results  Component Value Date   WBC 8.7 05/11/2024   HGB 12.3 (L) 05/11/2024   HCT 37.9 (L) 05/11/2024   MCV 96.9 05/11/2024   PLT 165 05/11/2024    Recent Labs  Lab 05/11/24 0438  NA 136  K 3.4*  CL 107  CO2 23  BUN 13  CREATININE 0.82  CALCIUM  8.6*  GLUCOSE 208*   BNP (last 3 results) Recent Labs    05/10/24 1500  BNP 82.8   Cardiac Panel (last 3 results) Recent Labs    05/10/24 1502 05/10/24 1720  TROPONINIHS 10 9     BNP (last 3 results) Recent Labs    05/10/24 1500  BNP 82.8    FOLLOW UP PLANS AND APPOINTMENTS  Allergies as of 05/11/2024       Reactions   Cantaloupe Extract Allergy Skin Test Swelling   Swelling of tongue Swelling of tongue        Medication List     STOP taking these medications    cephALEXin  500 MG capsule Commonly known as:  KEFLEX    Paxlovid  (300/100) 20 x 150 MG & 10 x 100MG  Tbpk Generic drug: nirmatrelvir/ritonavir   tiZANidine 4 MG capsule Commonly known as: ZANAFLEX       TAKE these medications    acetaminophen  500 MG tablet Commonly known as: TYLENOL  Take 500 mg by mouth every 6 (six) hours as needed for mild pain (pain score 1-3).   amLODipine  10 MG tablet Commonly known as: NORVASC  Take 1 tablet (10 mg total) by mouth daily. What changed: when to take this   atorvastatin  20 MG tablet Commonly known as: LIPITOR Take 20 mg by mouth every evening.   carvedilol  25 MG tablet Commonly known as: COREG  Take 50 mg by mouth 2 (two) times daily with a meal.   clopidogrel  75 MG tablet Commonly known as: PLAVIX  Take 75 mg by mouth every evening.   DULoxetine  60  MG capsule Commonly known as: CYMBALTA  Take 60 mg by mouth 2 (two) times daily.   furosemide  20 MG tablet Commonly known as: LASIX  Take 20 mg by mouth daily.   ibuprofen  600 MG tablet Commonly known as: ADVIL  Take 1 tablet (600 mg total) by mouth every 6 (six) hours as needed. What changed: reasons to take this   Lantus 100 UNIT/ML injection Generic drug: insulin  glargine Inject 35 Units into the skin at bedtime.   lisinopril  20 MG tablet Commonly known as: ZESTRIL  Take 20 mg by mouth daily.   metFORMIN  1000 MG tablet Commonly known as: GLUCOPHAGE  Take 1 tablet (1,000 mg total) by mouth 2 (two) times daily with a meal.   nitroGLYCERIN 0.4 MG SL tablet Commonly known as: NITROSTAT Place 0.4 mg under the tongue every 5 (five) minutes as needed for chest pain.   ondansetron  4 MG disintegrating tablet Commonly known as: Zofran  ODT Take 1 tablet (4 mg total) by mouth every 8 (eight) hours as needed for nausea or vomiting.   pantoprazole 40 MG tablet Commonly known as: PROTONIX Take 40 mg by mouth daily.   potassium chloride  10 MEQ tablet Commonly known as: KLOR-CON  M Take 10 mEq by mouth daily.   solifenacin 10 MG  tablet Commonly known as: VESICARE Take 10 mg by mouth daily.   topiramate  50 MG tablet Commonly known as: TOPAMAX  Take 50 mg by mouth every evening.   topiramate  100 MG tablet Commonly known as: TOPAMAX  Take 100 mg by mouth every evening.        Gordy Bergamo, MD, [facility] 05/11/2024, 12:58 PM [facility] [address] [place], [place] [address] Phone: [phone]. Fax:  [phone]

## 2024-05-11 NOTE — ED Notes (Signed)
 CCMD called, pt on monitor
